# Patient Record
Sex: Female | Born: 1985 | Race: White | Hispanic: No | Marital: Married | Smoking: Never smoker
Health system: Southern US, Community
[De-identification: ages and names within clinical notes are randomized; demographics above are authoritative.]

## PROBLEM LIST (undated history)

## (undated) DIAGNOSIS — R87619 Unspecified abnormal cytological findings in specimens from cervix uteri: Secondary | ICD-10-CM

## (undated) DIAGNOSIS — I341 Nonrheumatic mitral (valve) prolapse: Secondary | ICD-10-CM

## (undated) DIAGNOSIS — R011 Cardiac murmur, unspecified: Secondary | ICD-10-CM

## (undated) DIAGNOSIS — K219 Gastro-esophageal reflux disease without esophagitis: Secondary | ICD-10-CM

## (undated) DIAGNOSIS — O1490 Unspecified pre-eclampsia, unspecified trimester: Secondary | ICD-10-CM

## (undated) DIAGNOSIS — G43909 Migraine, unspecified, not intractable, without status migrainosus: Secondary | ICD-10-CM

## (undated) DIAGNOSIS — F419 Anxiety disorder, unspecified: Secondary | ICD-10-CM

## (undated) DIAGNOSIS — K5090 Crohn's disease, unspecified, without complications: Secondary | ICD-10-CM

## (undated) HISTORY — DX: Crohn's disease, unspecified, without complications: K50.90

## (undated) HISTORY — PX: COLONOSCOPY: SHX174

## (undated) HISTORY — DX: Migraine, unspecified, not intractable, without status migrainosus: G43.909

## (undated) HISTORY — DX: Unspecified abnormal cytological findings in specimens from cervix uteri: R87.619

## (undated) HISTORY — PX: BREAST SURGERY: SHX581

## (undated) HISTORY — DX: Anxiety disorder, unspecified: F41.9

## (undated) HISTORY — PX: CHOLECYSTECTOMY: SHX55

## (undated) HISTORY — DX: Gastro-esophageal reflux disease without esophagitis: K21.9

## (undated) HISTORY — DX: Cardiac murmur, unspecified: R01.1

---
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
## 2005-09-30 ENCOUNTER — Ambulatory Visit: Payer: Self-pay | Admitting: Unknown Physician Specialty

## 2005-10-02 ENCOUNTER — Ambulatory Visit: Payer: Self-pay | Admitting: Gastroenterology

## 2005-10-03 ENCOUNTER — Ambulatory Visit: Payer: Self-pay | Admitting: Gastroenterology

## 2005-11-08 ENCOUNTER — Emergency Department: Payer: Self-pay | Admitting: Emergency Medicine

## 2005-11-11 ENCOUNTER — Inpatient Hospital Stay: Payer: Self-pay | Admitting: Gastroenterology

## 2008-08-20 ENCOUNTER — Emergency Department: Payer: Self-pay | Admitting: Emergency Medicine

## 2008-08-22 ENCOUNTER — Observation Stay: Payer: Self-pay | Admitting: Specialist

## 2010-10-15 ENCOUNTER — Ambulatory Visit: Payer: Self-pay | Admitting: Internal Medicine

## 2012-07-03 ENCOUNTER — Observation Stay: Payer: Self-pay | Admitting: Internal Medicine

## 2012-07-31 ENCOUNTER — Inpatient Hospital Stay: Payer: Self-pay

## 2012-07-31 LAB — CBC WITH DIFFERENTIAL/PLATELET
Basophil #: 0 10*3/uL (ref 0.0–0.1)
Basophil %: 0.1 %
Eosinophil #: 0 10*3/uL (ref 0.0–0.7)
Lymphocyte %: 13.2 %
MCH: 32.3 pg (ref 26.0–34.0)
MCHC: 35 g/dL (ref 32.0–36.0)
MCV: 92 fL (ref 80–100)
Monocyte %: 4.7 %
Neutrophil %: 81.7 %
Platelet: 237 10*3/uL (ref 150–440)

## 2012-11-09 LAB — URINALYSIS, COMPLETE
Bilirubin,UR: NEGATIVE
Glucose,UR: NEGATIVE mg/dL (ref 0–75)
Leukocyte Esterase: NEGATIVE
Ph: 5 (ref 4.5–8.0)
RBC,UR: 10 /HPF (ref 0–5)
Squamous Epithelial: 23
WBC UR: 2 /HPF (ref 0–5)

## 2012-11-09 LAB — COMPREHENSIVE METABOLIC PANEL
Anion Gap: 6 — ABNORMAL LOW (ref 7–16)
Bilirubin,Total: 0.6 mg/dL (ref 0.2–1.0)
Co2: 27 mmol/L (ref 21–32)
Creatinine: 1.04 mg/dL (ref 0.60–1.30)
EGFR (Non-African Amer.): >60
Glucose: 95 mg/dL (ref 65–99)
Osmolality: 275 (ref 275–301)
Potassium: 3.6 mmol/L (ref 3.5–5.1)
Sodium: 138 mmol/L (ref 136–145)
Total Protein: 7.7 g/dL (ref 6.4–8.2)

## 2012-11-09 LAB — CBC
HGB: 14.2 g/dL (ref 12.0–16.0)
MCH: 31.5 pg (ref 26.0–34.0)
MCHC: 34.4 g/dL (ref 32.0–36.0)
MCV: 92 fL (ref 80–100)
Platelet: 233 10*3/uL (ref 150–440)

## 2012-11-09 LAB — LIPASE, BLOOD: Lipase: 107 U/L (ref 73–393)

## 2012-11-10 ENCOUNTER — Observation Stay: Payer: Self-pay | Admitting: Internal Medicine

## 2012-11-10 LAB — CBC WITH DIFFERENTIAL/PLATELET
Basophil %: 0.2 %
HCT: 35.9 % (ref 35.0–47.0)
HGB: 12.5 g/dL (ref 12.0–16.0)
Lymphocyte %: 24.5 %
MCH: 31.8 pg (ref 26.0–34.0)
MCHC: 34.9 g/dL (ref 32.0–36.0)
MCV: 91 fL (ref 80–100)
Neutrophil #: 3.2 10*3/uL (ref 1.4–6.5)
Platelet: 203 10*3/uL (ref 150–440)
RBC: 3.95 10*6/uL (ref 3.80–5.20)
RDW: 13.4 % (ref 11.5–14.5)

## 2012-11-10 LAB — COMPREHENSIVE METABOLIC PANEL
Alkaline Phosphatase: 54 U/L (ref 50–136)
BUN: 9 mg/dL (ref 7–18)
Chloride: 106 mmol/L (ref 98–107)
EGFR (African American): >60
EGFR (Non-African Amer.): >60
Osmolality: 274 (ref 275–301)
Potassium: 3.4 mmol/L — ABNORMAL LOW (ref 3.5–5.1)
SGOT(AST): 24 U/L (ref 15–37)
SGPT (ALT): 50 U/L (ref 12–78)
Sodium: 138 mmol/L (ref 136–145)

## 2012-11-10 LAB — PREGNANCY, URINE: Pregnancy Test, Urine: NEGATIVE m[IU]/mL

## 2014-05-26 ENCOUNTER — Ambulatory Visit (INDEPENDENT_AMBULATORY_CARE_PROVIDER_SITE_OTHER): Payer: 59

## 2014-05-26 ENCOUNTER — Other Ambulatory Visit: Payer: Self-pay | Admitting: *Deleted

## 2014-05-26 ENCOUNTER — Ambulatory Visit (INDEPENDENT_AMBULATORY_CARE_PROVIDER_SITE_OTHER): Payer: 59 | Admitting: Podiatry

## 2014-05-26 ENCOUNTER — Encounter: Payer: Self-pay | Admitting: Podiatry

## 2014-05-26 VITALS — BP 113/58 | HR 88 | Resp 16 | Ht 68.0 in | Wt 230.0 lb

## 2014-05-26 DIAGNOSIS — M779 Enthesopathy, unspecified: Secondary | ICD-10-CM

## 2014-05-26 DIAGNOSIS — M8448XA Pathological fracture, other site, initial encounter for fracture: Secondary | ICD-10-CM

## 2014-05-26 NOTE — Progress Notes (Signed)
   Subjective:    Patient ID: Angela Braun, female    DOB: July 12, 1986, 28 y.o.   MRN: [id number]  HPI Comments: Last week at work i was walking and i am not sure if i turned it wrong or what it started hurting . 2,3,4 toes to the top of foot is hurting   Foot Injury       Review of Systems  All other systems reviewed and are negative.      Objective:   Physical Exam        Assessment & Plan:

## 2014-05-26 NOTE — Progress Notes (Signed)
Subjective:     Patient ID: Angela Braun, female   DOB: 26-Jun-1986, 28 y.o.   MRN: [id number]  Foot Injury    patient presents stating last Wednesday I turned my right foot and it started to become extremely sore. I feel like I'm walking on a broken bone and I also get numbness in my second third and fourth toes   Review of Systems  All other systems reviewed and are negative.      Objective:   Physical Exam  Nursing note and vitals reviewed. Constitutional: She is oriented to person, place, and time.  Cardiovascular: Intact distal pulses.   Musculoskeletal: Normal range of motion.  Neurological: She is oriented to person, place, and time.  Skin: Skin is warm.   neurovascular status intact with muscle strength adequate and range of motion of subtalar midtarsal joint within normal limits. Patient is noted to have significant forefoot edema right with digits that are well perfused and is noted to have exquisite discomfort around the second metatarsal shaft right extending into the metatarsophalangeal joint    Assessment:     Possible stress fracture of the right second metatarsal shaft with other inflammatory conditions that cannot be excluded    Plan:     H&P and x-ray reviewed. Due to the level of discomfort I did go ahead today and I applied an air fracture walker to reduce all stress on the foot and advised on ice and compression therapy. Reappoint in 3 weeks

## 2014-06-16 ENCOUNTER — Ambulatory Visit (INDEPENDENT_AMBULATORY_CARE_PROVIDER_SITE_OTHER): Payer: 59

## 2014-06-16 ENCOUNTER — Ambulatory Visit (INDEPENDENT_AMBULATORY_CARE_PROVIDER_SITE_OTHER): Payer: 59 | Admitting: Podiatry

## 2014-06-16 VITALS — BP 147/76 | HR 93 | Resp 16

## 2014-06-16 DIAGNOSIS — S92301D Fracture of unspecified metatarsal bone(s), right foot, subsequent encounter for fracture with routine healing: Secondary | ICD-10-CM

## 2014-06-16 NOTE — Progress Notes (Signed)
Subjective:     Patient ID: Angela Braun, female   DOB: Feb 03, 1986, 28 y.o.   MRN: [id number]  HPI patient states that it is still tender but improved from over where it was but still requires boot for a good period of time   Review of Systems     Objective:   Physical Exam Neurovascular status intact with discomfort mostly in the second metatarsal distal shaft when pressed    Assessment:     Probable stress fracture right second metatarsal that's healing slowly but improving    Plan:     X-ray reviewed and continue with immobilization with gradual increase in tennis shoe usage and should be out of the boot within the next 3-4 weeks. Continue ice therapy and reappoint if symptoms continue past another 4-6 weeks

## 2014-10-19 ENCOUNTER — Ambulatory Visit: Payer: Self-pay | Admitting: Internal Medicine

## 2014-12-29 NOTE — Consult Note (Signed)
PATIENT NAME:  Angela Braun, SAGEL MR#:  [id number] DATE OF BIRTH:  05-23-1986  DATE OF CONSULTATION:  11/10/2012  CONSULTING PHYSICIAN:  Cristal Deer A. Lundquist, MD  REASON FOR CONSULTATION: Right upper quadrant abdominal pain, nausea and vomiting.   HISTORY OF PRESENT ILLNESS: The patient is a pleasant 29 year old female with history of hypertension who presented to the ER a history of 3 days of right upper quadrant pain with nausea and vomiting. She says that this began acutely. She has no sick contacts. She also developed diarrhea, which has resolved. She had 1 temperature of 101.3, which has resolved with Aleve. Is feeling better all in all, still has some uncomfortableness in her right upper quadrant. Pain medicine has made it better. Has had a history of what she said is Crohn disease, which was initially diagnosed on colonoscopy approximately 2 years ago that she was treated for, and repeat colonoscopy did not show any illness so is no longer on medications for Crohn's. Also no night sweats, shortness of breath, cough, chest pain, current nausea, vomiting or recent diarrhea. No dysuria or hematuria. No headaches. No abnormal sensations in extremities.   PAST MEDICAL HISTORY: Hypertension, postpartum depression, questionable Crohn's.   PAST SURGICAL HISTORY: None.   ALLERGIES: PENICILLIN AND TAPE.   HOME MEDICATIONS: Prenatal vitamins, metoprolol and ibuprofen p.r.n.   SOCIAL HISTORY: Lives in [place] with husband. Denies smoking and street drugs. Social alcohol use.   FAMILY HISTORY: Has a father who was deceased with history of cancer, which she is uncertain regarding.   REVIEW OF SYSTEMS: Total 12-point review of systems was obtained. Pertinent positives and negatives as above.   PHYSICAL EXAMINATION:  VITAL SIGNS: Temperature 98.3, pulse 88, blood pressure 97/65, respirations 18, 95% on room air.  GENERAL: No acute distress. Alert and oriented x 3.  HEAD: Normocephalic,  atraumatic.  EYES: No scleral icterus. No conjunctivitis.  FACE: No obvious facial trauma. Normal external nose. Normal external ears.  NECK: Soft, supple. No obvious masses.  CHEST: Lungs clear to auscultation, moving air well.  HEART: Regular rate and rhythm. No murmurs, rubs or gallops.  ABDOMEN: Soft, minimally tender, nondistended.  EXTREMITIES: Moves all extremities well. Strength 5/5.  NEUROLOGIC: Cranial nerves II through XII grossly intact. Sensation intact all 4 extremities.   LABORATORY AND RADIOLOGIC DATA: Unremarkable white cell count today of 5.0 with 63.6% neutrophils. I have reviewed all pertinent positives and negatives. Does have 10+ red blood cells, but on a dirty urinalysis. Ultrasound shows normal gallbladder with dilated common bile duct to 6.9 mm. MRCP shows no obvious filling defects.   ASSESSMENT AND PLAN: The patient is a pleasant 29 year old female with 3 days of nausea, vomiting, diarrhea. This has improved. Her pain has improved. Has no white cell count, no tachycardia, relatively nontender exam and no gallstones on ultrasound. Also has no filling defects on MRCP and no LFT abnormalities. I would favor gastroenteritis; however, with history of treatment for Crohn's, will always consider that as this pain is somewhat similar. No need for cholecystectomy. If continues to have pain despite negative workup, will consider HIDA with ejection fraction but as this is the first time that she has had this pain, I am unwilling to take her gallbladder out for biliary dyskinesia until other possibilities have been ruled out.    ____________________________ Si Raider. Lundquist, MD cal:jm D: 11/10/2012 16:35:00 ET T: 11/10/2012 17:51:21 ET JOB#: [id number]  cc: Cristal Deer A. Lundquist, MD, <Dictator> Jarvis Newcomer MD ELECTRONICALLY SIGNED 11/13/2012 11:12

## 2014-12-29 NOTE — Discharge Summary (Signed)
PATIENT NAME:  Angela Braun, Angela Braun MR#:  [id number] DATE OF BIRTH:  [birth year]  DATE OF ADMISSION:  11/10/2012 DATE OF DISCHARGE:  11/11/2012   FINAL DIAGNOSES: Viral gastroenteritis, hypertension, depression, history of Crohn's disease.   HISTORY OF PRESENT ILLNESS: A 29 year old female with past medical history of hypertension and postpartum depression presenting to the ER with chief complaint of a 3-day history of right upper quadrant pain associated with nausea and vomiting. Eventually, she developed diarrhea the day before, which was completely resolved on the day of presentation to the Emergency Room. She developed a temperature of 101.3 degrees Fahrenheit, so she decided to present to the Emergency Room. No dizziness or loss of consciousness. No any sick contacts. A right upper quadrant ultrasound was done, which revealed no gallstone but dilated common bile duct, which was 6.9 mm.   HOSPITAL COURSE AND STAY: As mentioned above, a right upper quadrant sonogram was done, which showed dilated CBD but no gallstone, normal LFTs. So, MRCP was done, which was normal too. So, we attributed her complaints to possible gastroenteritis, and IV fluids and morphine continued.   OTHER MEDICAL ISSUES:  1. Hypertension, continued on metoprolol. 2. Postpartum depression, which was a stable issue.   3. History of Crohn's disease, which was stable, and she was advised to follow with her primary care physician for this.   LABORATORY RESULTS IN THE HOSPITAL: WBC on presentation 9100, creatinine was 1.04, sodium 138, lipase 107. Urinalysis grossly negative. MRCP showed normal exam, no evidence of biliary obstruction or mass.   CONSULTATIONS DURING THE HOSPITAL: Surgical consult with Dr. Juliann Pulse. He suggested really unlikely cholecystitis, and so he said no need for surgical intervention at this time.   CONDITION ON DISCHARGE: Stable.   CODE STATUS ON DISCHARGE: Full code.   MEDICATIONS ON DISCHARGE: Prenatal  multivitamin, oral tablet once daily, metoprolol 50 mg every 12 hours.   HOME OXYGEN: No.   DIET: Low sodium, regular consistency.   ACTIVITY: As tolerated.   TIMEFRAME TO FOLLOW-UP: Within 1 to 2 weeks with primary care physician.   TOTAL TIME SPENT: 45 minutes.    ____________________________ Hope Pigeon Elisabeth Pigeon, MD vgv:lo D: 11/15/2012 23:59:30 ET T: 11/16/2012 11:29:38 ET JOB#: [id number]  cc: Hope Pigeon. Elisabeth Pigeon, MD, <Dictator> Altamese Dilling MD ELECTRONICALLY SIGNED 12/06/2012 9:25

## 2014-12-29 NOTE — H&P (Signed)
PATIENT NAME:  Angela Braun, Angela Braun MR#:  [id number] DATE OF BIRTH:  [birth year]  DATE OF ADMISSION:  11/10/2012  PRIMARY CARE PHYSICIAN:  Nonlocal.   REFERRING PHYSICIAN:  Dr. Manson Passey.   CHIEF COMPLAINT:  Right upper quadrant abdominal pain associated with nausea, vomiting.   HISTORY OF PRESENT ILLNESS:  The patient is a 29 year old female with a past medical history of hypertension and postpartum depression is presenting to the ER with a chief complaint of a three day history of right upper quadrant abdominal pain associated with nausea and vomiting.  Eventually, she has developed diarrhea yesterday which was completely resolved today.  She developed temperature of 101.3 degrees Fahrenheit x 1 which was completely resolved with one dose of Aleve.  The patient denies any dizziness or loss of consciousness.  No sick contacts.  No similar complaints in the past.  Right upper quadrant ultrasound has revealed no gallstones, but dilated common bile duct which is 6.9 mm.  The patient was given some pain medicine and hospitalist team is called to admit the patient.  The ER physician has also called the on-call surgeon Dr. Anda Kraft who has recommended to get MRCP done in the a.m.  During my examination, the patient is reporting sharp right upper quadrant abdominal pain without any radiation associated with nausea and vomiting.  The pain is 7 out of 10.  No similar complaints in the past.  Denies any chest pain or shortness of breath.  Denies any blood in her vomit or stool.   PAST MEDICAL HISTORY:  Hypertension, postpartum depression.   PAST SURGICAL HISTORY:  None.   ALLERGIES:  To PENICILLIN AND TAPE.   HOME MEDICATIONS:  Prenatal multivitamins once a day, metoprolol tartrate 50 mg q. 12 hours, ibuprofen 600 mg q. 6 hours as needed.   PSYCHOSOCIAL HISTORY:  Lives at home with husband.  Denies smoking.  Occasional intake of alcohol.  Denies any street drugs.   FAMILY HISTORY:  Her father deceased with some  cancer.   REVIEW OF SYSTEMS:   CONSTITUTIONAL:  One episode of fever which is resolved with Aleve.  Denies any weakness or fatigue.  Complaining of right upper quadrant abdominal pain.  EYES:  No blurry vision or glaucoma.  EARS, NOSE, THROAT:  Denies any epistaxis, discharge, postnasal drip.  RESPIRATORY:  Denies any COPD, cough, wheezing.  CARDIOVASCULAR:  No chest pain or palpitations.  GASTROINTESTINAL:  Complaining of nausea, vomiting, diarrhea and right upper quadrant abdominal pain.  No hematemesis or melena.  GENITOURINARY:  No dysuria, hematuria.  GYNECOLOGIC AND BREASTS:  No breast mass or vaginal discharge.  ENDOCRINE:  Denies any polyuria, nocturia or thyroid problems.  HEMATOLOGIC AND LYMPHATIC:  No anemia, easy bruising or bleeding.  INTEGUMENTARY:  No acne, rash, lesions.  MUSCULOSKELETAL:  No joint pain.  Denies any arthritis or gout.  NEUROLOGIC:  Denies any vertigo, ataxia, dementia, headache.  PSYCHIATRIC:  Has postpartum depression.  Denies any ADD, OCD.   PHYSICAL EXAMINATION: VITAL SIGNS:  Temperature 98 degrees Fahrenheit, pulse 90, respirations 20, blood pressure is 93/59, pulse of 98%.  GENERAL APPEARANCE:  Not under acute distress, moderately built and moderately nourished, obese.  HEENT:  Normocephalic, atraumatic.  Pupils are equally reacting to light and accommodation.  No scleral icterus.  No conjunctival injection.  Extraocular movements are intact.  No postnasal drip.  No sinus tenderness.  Moist mucous membranes.  Tympanic membranes are intact.  NECK:  Supple.  No JVD.  No thyromegaly.  Range of motion  is intact.  LUNGS:  Clear to auscultation bilaterally.  No accessory muscle usage.  No anterior chest wall tenderness on palpation.  CARDIAC:  S1, S2 normal.  Regular rate and rhythm.  No murmurs.  No peripheral edema.  GASTROINTESTINAL:  Soft.  Bowel sounds are positive in all four quadrants.  Positive right upper quadrant tenderness.  No rebound tenderness.   Positive Murphy's sign.  No CVA tenderness.  No masses felt.  No hepatosplenomegaly.  NEUROLOGIC:  Awake, alert, oriented x 3.  Cranial nerves II through XII are grossly intact.  Motor and sensory are grossly intact.  Reflexes are 2+.  EXTREMITIES:  No edema.  No cyanosis.  No clubbing, 2+ peripheral pulses.  SKIN:  No rashes, lesions, acne.  Normal turgor.  Warm to touch.  PSYCHIATRIC:  Normal mood and affect.   LABORATORY AND IMAGING STUDIES:  Ultrasound of the abdomen has revealed abnormal dilatation of the common bile duct without definite intrahepatic biliary ductal dilatation.  The common bile duct is abnormally distended up to 6.9 mm.  Distal obstructing process is a consideration.  No definite cholelithiasis evident.  No definite findings of acute cholecystitis.  Her glucose 95, BUN 11, creatinine 1.04, sodium 138, potassium 3.6, chloride 105, CO2 27, GFR greater than 60, osmolality 275, calcium 8.6, lipase 107.  LFTs are within normal range.  WBC 9.1, hemoglobin 14.2, hematocrit 41.2, platelet count is 233,000, MCV 92.  Urinalysis, stat pregnancy test is ordered which is pending.  Nitrites are negative, leukocyte esterase negative, glucose negative, bili negative, ketones negative.  Mucous is present.   ASSESSMENT AND PLAN:  The patient is a 29 year old moderately obese female presenting with right upper quadrant abdominal pain associated with one episode of fever and vomiting for three days, will be admitted with the following assessment and plan.  1.  Acute right upper quadrant abdominal pain with dilated common bile duct, but no gallstones and normal LFTs.  We will keep her nothing by mouth.  We will provide her IV fluids and proton pump inhibitor.  MRCP is ordered.  It will be done if pregnancy test is negative as recommended by surgery.  Surgical consult is placed to Dr. Anda Kraft and it was notified Dr. Anda Kraft by Dr. Manson Passey, the ER physician.  2.  Nausea, vomiting and diarrhea, which can be  from acute gastroenteritis, probably viral.  We will provide her IV fluids and antinausea medication.  Right now diarrhea is resolved and she is reporting no bowel movement since this afternoon. 3.  Postpartum depression.  Stable.  4.  Hypertension.  We will resume her home medication metoprolol.   5.  We will provide her gastrointestinal prophylaxis with Protonix IV.  6.  Deep vein thrombosis prophylaxis is not needed as the patient is ambulatory.   The diagnosis and plan of care was discussed in detail with the patient.  She is aware of the plan.   TOTAL TIME SPENT ON ADMISSION:  Is 50 minutes.    ____________________________ Ramonita Lab, MD ag:ea D: 11/10/2012 01:14:06 ET T: 11/10/2012 03:30:42 ET JOB#: [id number]  cc: Ramonita Lab, MD, <Dictator> Ramonita Lab MD ELECTRONICALLY SIGNED 11/12/2012 2:30

## 2015-01-16 NOTE — H&P (Signed)
L&D Evaluation:  History:   HPI 29 year old G1P0 presents to L&D at 13 3/7 weeks with SROM since 0530 this AM. Irreg ctx's noted upon arrival. EDD 08/11/12, [facility] at [facility] notable for early entry to care. No significant events during pregnancy, Pt does have hx of MVP which she takes Metoprolol for daily. Labs: O Positive, RI, VI, GBS Negative    Presents with leaking fluid    Patient's Medical History MVP, crohn's disease    Patient's Surgical History none  colonoscopy    Medications Pre Natal Vitamins  metoprolol    Allergies PCN    Social History none    Family History Non-Contributory   ROS:   ROS All systems were reviewed.  HEENT, CNS, GI, GU, Respiratory, CV, Renal and Musculoskeletal systems were found to be normal.   Exam:   Vital Signs stable    Urine Protein not completed    General no apparent distress    Mental Status clear    Abdomen gravid, non-tender    Estimated Fetal Weight Average for gestational age    Back no CVAT    Edema no edema    Pelvic no external lesions, 1-2 per RN exam    Mebranes Ruptured    Description clear    FHT normal rate with no decels    Ucx irregular   Impression:   Impression early labor, SROM   Plan:   Plan EFM/NST, monitor contractions and for cervical change, pitocin for augmentation   Electronic Signatures: Shella Maxim (CNM)  (Signed 23-Nov-13 11:44)  Authored: L&D Evaluation   Last Updated: 23-Nov-13 11:44 by Shella Maxim (CNM)

## 2015-01-16 NOTE — H&P (Signed)
L&D Evaluation:  History:   HPI 29 year old G1 P0 with EDC=08/11/2012 by LMP=11/05/2011 presents at 36 3/7 weeks with c/o leaking fluid x 2 days. Was unsure if she was leaking urine. Did have some bleeding yesterday after IC. Works long hours at work and has noticed contractions at the end of the day. Contractions yesterday resolved with rest and fluids. Baby active. Prenatal care at [facility] remarkable for early care with a 7w ultrasound confirming dates, MVP (takes metoprolol 50 mgm BID), and obesity.    Presents with leaking fluid    Patient's Medical History MVP    Patient's Surgical History colooscopy    Medications Pre Natal Vitamins  Metoprolol 50 mgm BID    Allergies PCN    Social History none    Family History Non-Contributory   ROS:   ROS see HPI   Exam:   Vital Signs stable  126/81, 123/77, 120/79. afebrile    Urine Protein not completed    General no apparent distress    Mental Status clear    Abdomen gravid, non-tender    Estimated Fetal Weight Average for gestational age    Fetal Position cephalic    Edema 1+    Pelvic no external lesions, SSE: no pooloing and Nitrazine negative. wet prep negative for hyphae, clue cells, or Trich. Cervix soft, closed internal os/50%/-2. discharge clear to white mucoepithelial    Mebranes Intact    FHT normal rate with no decels, reactive NST with baseline 125 and accels to 170    FHT Description mod variability    Fetal Heart Rate 125    Ucx irregular, mild    Skin dry   Impression:   Impression IUP at 34 3/7 weeks with no evidence of SROM or labor   Plan:   Plan DC home with labor precautions and work note to decrease hours to max of 8hrs/day and 40 hrs/week. 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 FU as scheduled at [facility] and prn.   Electronic Signatures: Karene Fry (CNM)  (Signed 26-Oct-13 14:44)  Authored: L&D Evaluation   Last Updated: 26-Oct-13 14:44 by Karene Fry  (CNM)

## 2015-10-24 LAB — HM PAP SMEAR: HM PAP: NORMAL

## 2015-11-14 ENCOUNTER — Other Ambulatory Visit: Payer: Self-pay | Admitting: Urology

## 2015-11-14 DIAGNOSIS — R3129 Other microscopic hematuria: Secondary | ICD-10-CM

## 2015-11-20 ENCOUNTER — Ambulatory Visit
Admission: RE | Admit: 2015-11-20 | Discharge: 2015-11-20 | Disposition: A | Discharge status: Home / Self Care | Payer: 59 | Source: Ambulatory Visit | Attending: Urology | Admitting: Urology

## 2015-11-20 DIAGNOSIS — R3129 Other microscopic hematuria: Secondary | ICD-10-CM

## 2015-11-20 HISTORY — DX: Nonrheumatic mitral (valve) prolapse: I34.1

## 2015-11-20 MED ORDER — IOHEXOL 300 MG/ML  SOLN
150.0000 mL | Freq: Once | INTRAMUSCULAR | Status: AC | PRN
  Administered 2015-11-20: 150 mL via INTRAVENOUS

## 2016-08-07 IMAGING — CT CT ABD-PEL WO/W CM
1 of 2 series · 13 of 32 positions shown, 19 images · IV contrast (APPLIED)
Comparison: Abdominal ultrasound of 11/09/2012.  CT 11/14/2005.

CLINICAL DATA: Microscopic hematuria. Pelvic discomfort after
urinating for 2 weeks.

EXAM:
CT ABDOMEN AND PELVIS WITHOUT AND WITH CONTRAST
TECHNIQUE: Multidetector CT imaging of the abdomen and pelvis was performed
following the standard protocol before and following the bolus
administration of intravenous contrast.
CONTRAST:  150mL OMNIPAQUE IOHEXOL 300 MG/ML  SOLN

[Series 2: axial pre · axial · non-contrast · 0.78mm/px · z∈[-991,-546]mm · 13 of 103 slices shown, 19 images]
[im 7/103  soft-tissue]
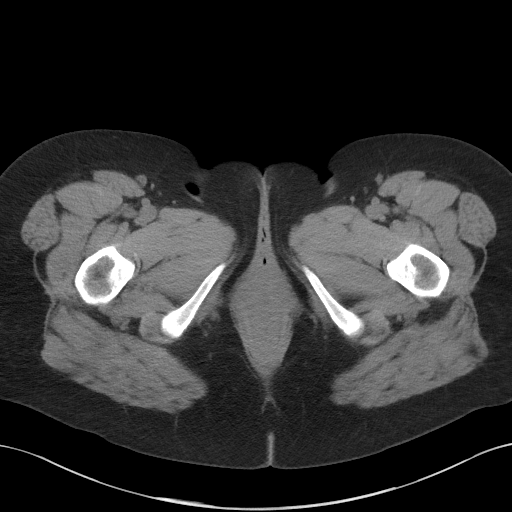
[im 7/103  bone]
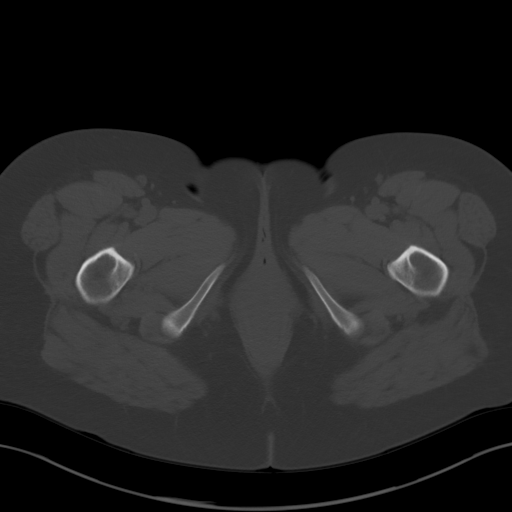
[im 14/103  soft-tissue]
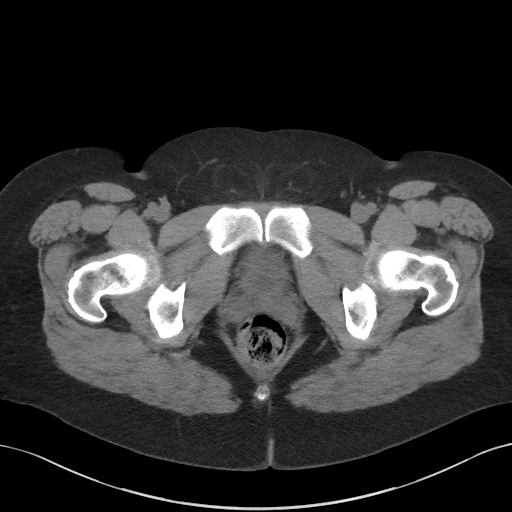
[im 21/103  soft-tissue]
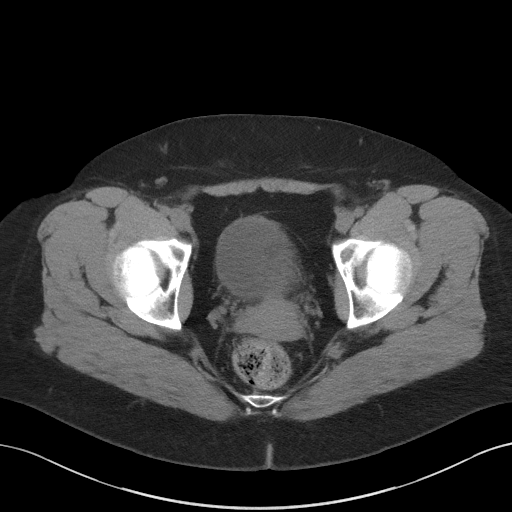
[im 28/103  soft-tissue]
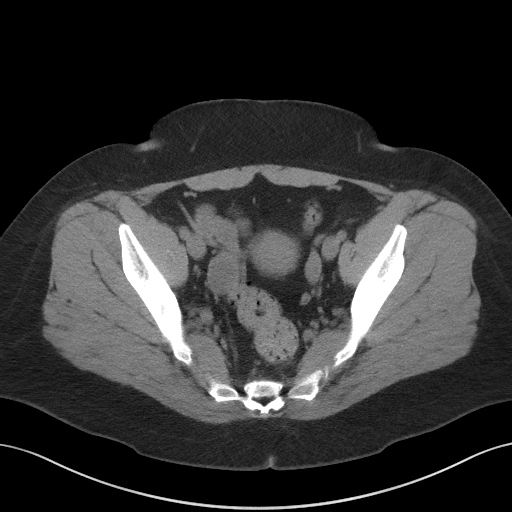
[im 35/103  soft-tissue]
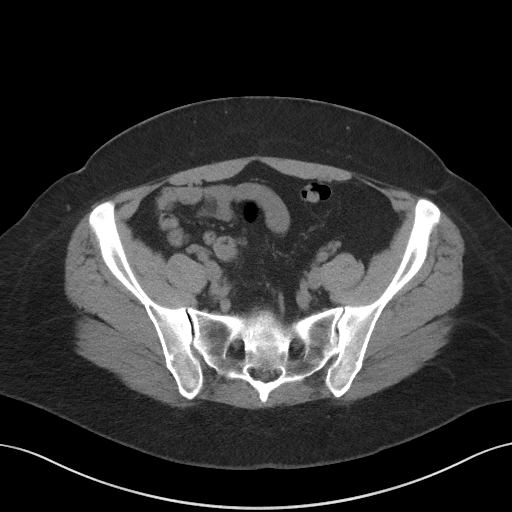
[im 41/103  soft-tissue]
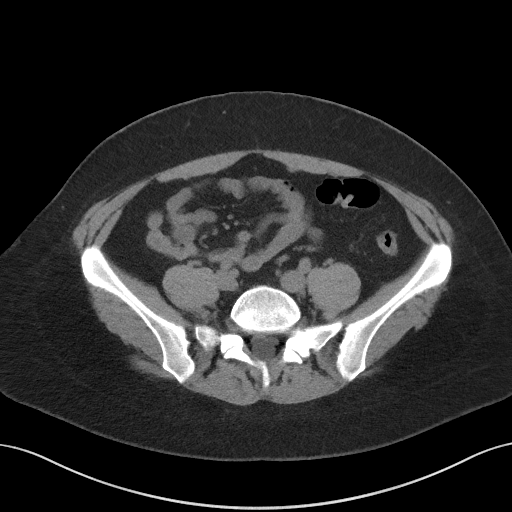
[im 55/103  soft-tissue]
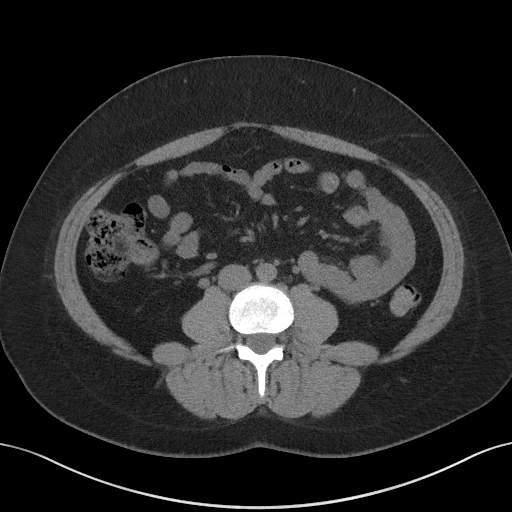
[im 62/103  soft-tissue]
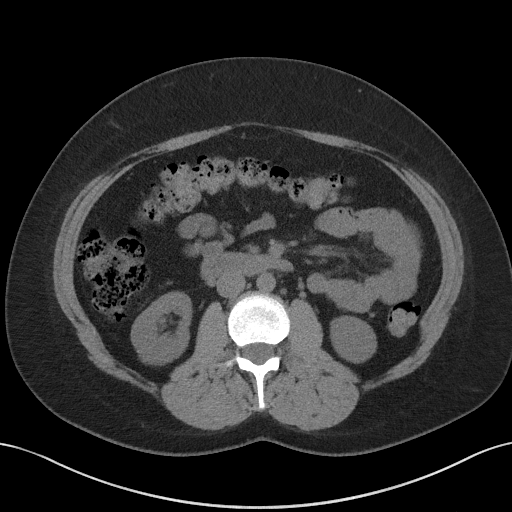
[im 69/103  soft-tissue]
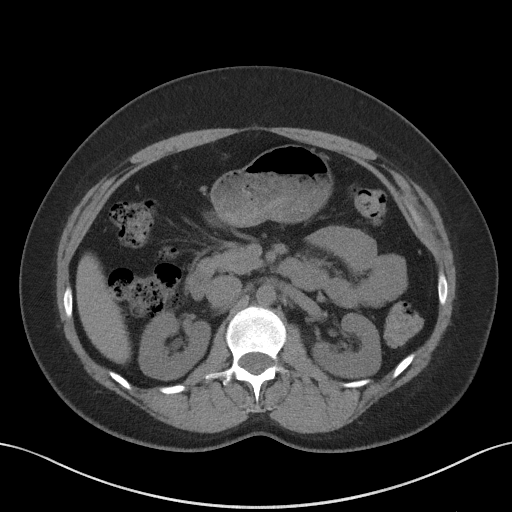
[im 69/103  bone]
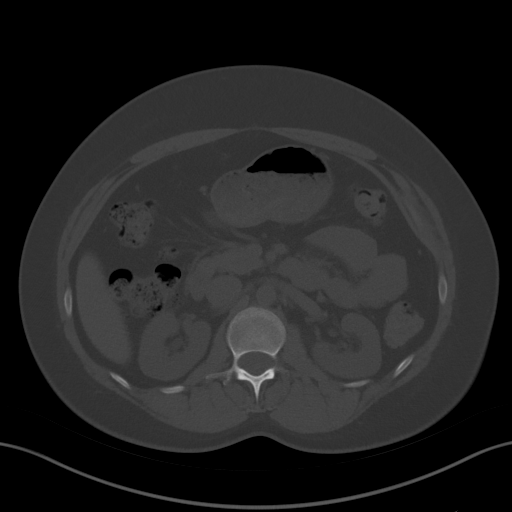
[im 75/103  soft-tissue]
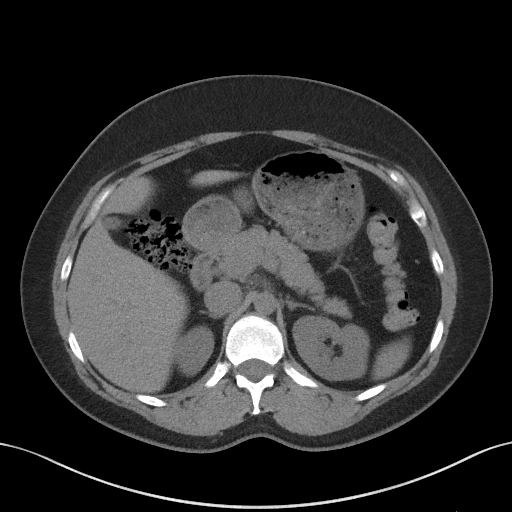
[im 75/103  lung]
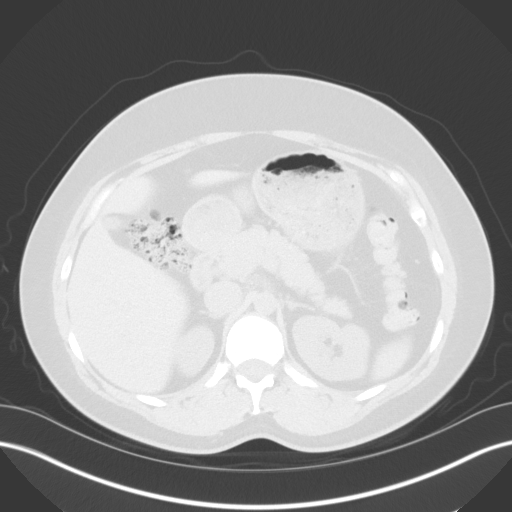
[im 82/103  soft-tissue]
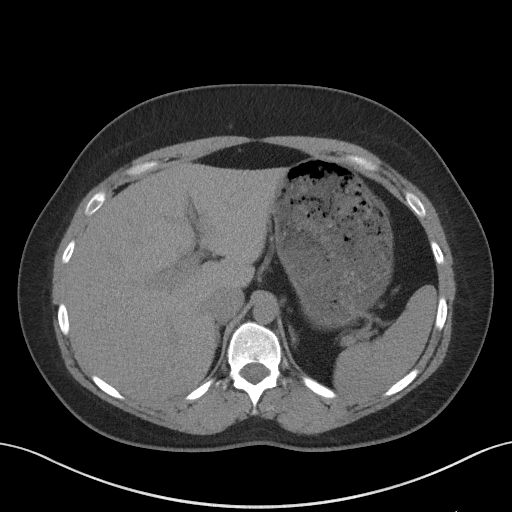
[im 82/103  lung]
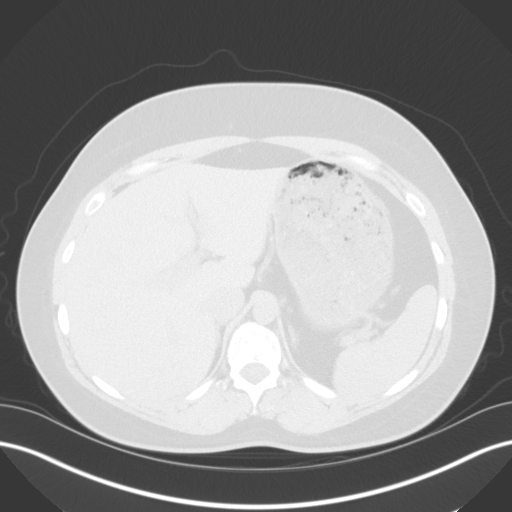
[im 89/103  soft-tissue]
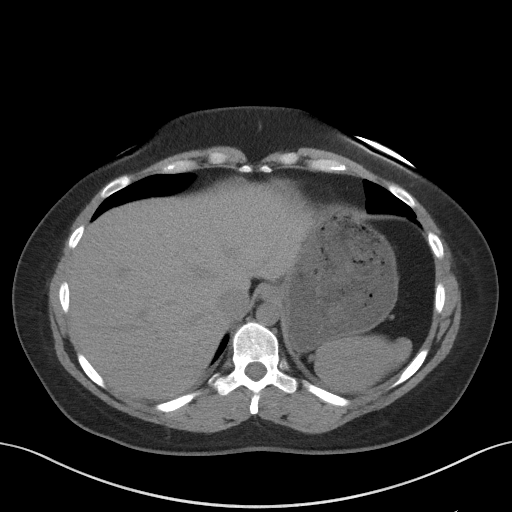
[im 89/103  lung]
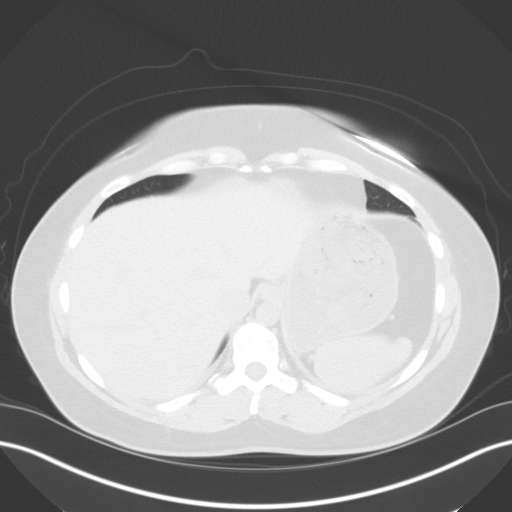
[im 96/103  soft-tissue]
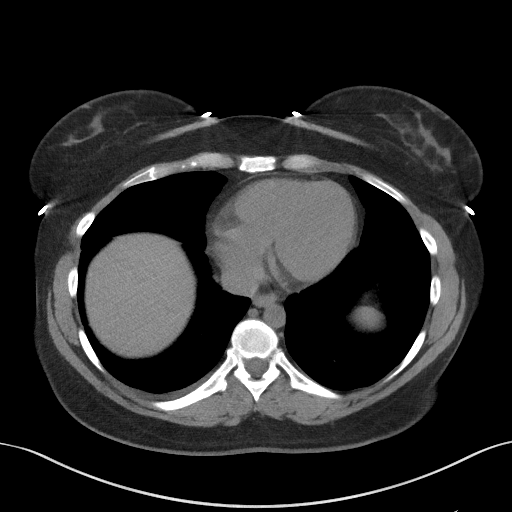
[im 96/103  lung]
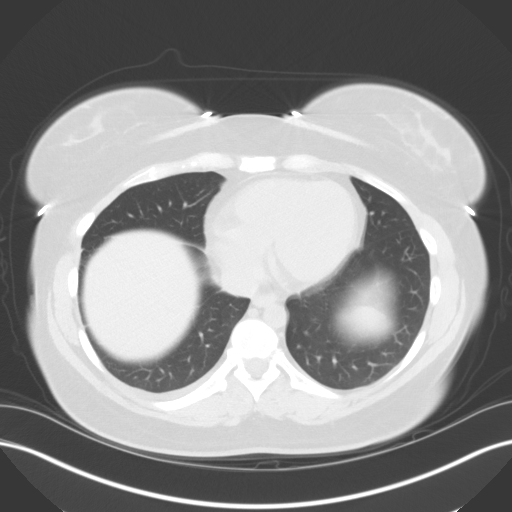

[13 of 32 positions shown; findings below may reference images not displayed]

FINDINGS: Lower chest: Clear lung bases. Normal heart size without pericardial
or pleural effusion.

Hepatobiliary: Normal liver. Normal gallbladder, without biliary
ductal dilatation.

Pancreas: Normal, without mass or ductal dilatation.

Spleen: Normal in size, without focal abnormality.

Adrenals/Urinary Tract: Normal adrenal glands. No renal calculi or
hydronephrosis. No hydroureter or ureteric calculi. No bladder
calculi.

No renal mass on post-contrast images. Moderate renal collecting
system opacification on delayed images. Moderate to good ureteric
opacification. No bladder filling defect.

Stomach/Bowel: Normal stomach, without wall thickening. Normal
colon, appendix, and terminal ileum. Normal small bowel.

Vascular/Lymphatic: Normal caliber of the aorta and branch vessels.
No abdominopelvic adenopathy.

Reproductive: Normal uterus and adnexa.

Other: No significant free fluid.

Musculoskeletal: No acute osseous abnormality.
IMPRESSION: No acute process or explanation for hematuria.

## 2017-11-11 ENCOUNTER — Ambulatory Visit (INDEPENDENT_AMBULATORY_CARE_PROVIDER_SITE_OTHER): Payer: Managed Care, Other (non HMO) | Admitting: Advanced Practice Midwife

## 2017-11-11 ENCOUNTER — Encounter: Payer: Self-pay | Admitting: Advanced Practice Midwife

## 2017-11-11 VITALS — BP 122/70 | Wt 204.0 lb

## 2017-11-11 DIAGNOSIS — Z113 Encounter for screening for infections with a predominantly sexual mode of transmission: Secondary | ICD-10-CM

## 2017-11-11 DIAGNOSIS — O0990 Supervision of high risk pregnancy, unspecified, unspecified trimester: Secondary | ICD-10-CM | POA: Insufficient documentation

## 2017-11-11 DIAGNOSIS — Z3480 Encounter for supervision of other normal pregnancy, unspecified trimester: Secondary | ICD-10-CM

## 2017-11-11 LAB — OB RESULTS CONSOLE GC/CHLAMYDIA
Chlamydia: NEGATIVE
GC PROBE AMP, GENITAL: NEGATIVE

## 2017-11-11 LAB — OB RESULTS CONSOLE VARICELLA ZOSTER ANTIBODY, IGG: Varicella: IMMUNE

## 2017-11-11 LAB — OB RESULTS CONSOLE RUBELLA ANTIBODY, IGM: RUBELLA: NON-IMMUNE/NOT IMMUNE

## 2017-11-11 NOTE — Progress Notes (Signed)
New Obstetric Patient H&P    Chief Complaint: "Desires prenatal care"   History of Present Illness: Patient is a 32 y.o. G2P1001 Not Hispanic or Latino female, presents with amenorrhea and positive home pregnancy test. Patient's last menstrual period was 10/10/2017. and based on her  LMP, her EDD is Estimated Date of Delivery: 07/17/2018. and her EGA is [redacted]w[redacted]d Cycles are 6. days, regular, and occur approximately every : 25 days. Her last pap smear was 2 years ago and was no abnormalities.    She had a urine pregnancy test which was positive 1 week(s)  ago. Her last menstrual period was normal and lasted for  4 day(s). Since her LMP she claims she has experienced breast tenderness, uncomfortable urination, itching on her legs. She denies vaginal bleeding. Her past medical history is contributory for gastrointestinal disease. Her prior pregnancies are notable for none  Since her LMP, she admits to the use of tobacco products  no She claims she has gained   8 pounds since the start of her pregnancy.  There are cats in the home in the home  no  She admits close contact with children on a regular basis  yes  She has had chicken pox in the past yes She has had Tuberculosis exposures, symptoms, or previously tested positive for TB   no Current or past history of domestic violence. no  Genetic Screening/Teratology Counseling: (Includes patient, baby's father, or anyone in either family with:)   [facility] 2. Thalassemia ([place] or [place] background): MCV<80  no 3. Neural tube defect (meningomyelocele, spina bifida, anencephaly)  no 4. Congenital heart defect  no  5. Down syndrome  no 6. Tay-Sachs ([place], [place]  no 7. Canavan's Disease  no 8. Sickle cell disease or trait (African)  no  9. Hemophilia or other blood disorders  no  10. Muscular dystrophy  no  11. Cystic fibrosis  no  12. Huntington's Chorea  no  13. Mental  retardation/autism  no 14. Other inherited genetic or chromosomal disorder  no 15. Maternal metabolic disorder (DM, PKU, etc)  no 16. Patient or FOB with a child with a birth defect not listed above no  16a. Patient or FOB with a birth defect themselves no 17. Recurrent pregnancy loss, or stillbirth  no  18. Any medications since LMP other than prenatal vitamins (include vitamins, supplements, OTC meds, drugs, alcohol)  no 19. Any other genetic/environmental exposure to discuss  no  Infection History:   1. Lives with someone with TB or TB exposed  no  2. Patient or partner has history of genital herpes  no 3. Rash or viral illness since LMP  no 4. History of STI (GC, CT, HPV, syphilis, HIV)  no 5. History of recent travel :  no  Other pertinent information:  no     Review of Systems:10 point review of systems negative unless otherwise noted in HPI  Past Medical History:  Past Medical History:  Diagnosis Date  . Abnormal Pap smear of cervix   . Crohn disease ([place]   . Migraine   . Mitral valve prolapse     Past Surgical History:  Past Surgical History:  Procedure Laterality Date  . COLONOSCOPY      Gynecologic History: Patient's last menstrual period was 10/10/2017.  Obstetric History: G2P1001  Family History:  Family History  Problem Relation Age of Onset  . Ovarian cancer Maternal Aunt  Social History:  Social History   Socioeconomic History  . Marital status: Married    Spouse name: Not on file  . Number of children: Not on file  . Years of education: Not on file  . Highest education level: Not on file  Social Needs  . Financial resource strain: Not on file  . Food insecurity - worry: Not on file  . Food insecurity - inability: Not on file  . Transportation needs - medical: Not on file  . Transportation needs - non-medical: Not on file  Occupational History  . Not on file  Tobacco Use  . Smoking status: Never Smoker  . Smokeless tobacco: Never  Used  Substance and Sexual Activity  . Alcohol use: No    Frequency: Never  . Drug use: No  . Sexual activity: Yes    Birth control/protection: None  Other Topics Concern  . Not on file  Social History Narrative  . Not on file    Allergies:  Allergies  Allergen Reactions  . Penicillins Hives    Medications: Prior to Admission medications   Not on File    Physical Exam Vitals: Blood pressure 122/70, weight 204 lb (92.5 kg), last menstrual period 10/10/2017.  General: NAD HEENT: normocephalic, anicteric Thyroid: no enlargement, no palpable nodules Pulmonary: No increased work of breathing, CTAB Cardiovascular: RRR, distal pulses 2+ Abdomen: NABS, soft, non-tender, non-distended.  Umbilicus without lesions.  No hepatomegaly, splenomegaly or masses palpable. No evidence of hernia  Genitourinary: deferred for early stage of pregnancy, no concerns, PAP screening interval/shared decision making   Extremities: no edema, erythema, or tenderness Neurologic: Grossly intact Psychiatric: mood appropriate, affect full   Assessment: 32 y.o. G2P1001 at 22w4dby LMP presenting to initiate prenatal care  Plan: 1) Avoid alcoholic beverages. 2) Patient encouraged not to smoke.  3) Discontinue the use of all non-medicinal drugs and chemicals.  4) Take prenatal vitamins daily.  5) Nutrition, food safety (fish, cheese advisories, and high nitrite foods) and exercise discussed. 6) Hospital and practice style discussed with cross coverage system.  7) Genetic Screening, such as with 1st Trimester Screening, cell free fetal DNA, AFP testing, and Ultrasound, as well as with amniocentesis and CVS as appropriate, is discussed with patient. At the conclusion of today's visit patient requested genetic testing 8) Patient is asked about travel to areas at risk for the Zika virus, and counseled to avoid travel and exposure to mosquitoes or sexual partners who may have themselves been exposed to the  virus. Testing is discussed, and will be ordered as appropriate.  9) Dating scan in 3 weeks   JRod Can CS[facility], CEdgertonGroup 11/11/2017, 9:51 AM

## 2017-11-11 NOTE — Progress Notes (Signed)
NOB today.  

## 2017-11-11 NOTE — Patient Instructions (Signed)
Prenatal Care WHAT IS PRENATAL CARE? Prenatal care is the process of caring for a pregnant woman before she gives birth. Prenatal care makes sure that she and her baby remain as healthy as possible throughout pregnancy. Prenatal care may be provided by a midwife, family practice health care provider, or a childbirth and pregnancy specialist (obstetrician). Prenatal care may include physical examinations, testing, treatments, and education on nutrition, lifestyle, and social support services. WHY IS PRENATAL CARE SO IMPORTANT? Early and consistent prenatal care increases the chance that you and your baby will remain healthy throughout your pregnancy. This type of care also decreases a baby's risk of being born too early (prematurely), or being born smaller than expected (small for gestational age). Any underlying medical conditions you may have that could pose a risk during your pregnancy are discussed during prenatal care visits. You will also be monitored regularly for any new conditions that may arise during your pregnancy so they can be treated quickly and effectively. WHAT HAPPENS DURING PRENATAL CARE VISITS? Prenatal care visits may include the following: Discussion Tell your health care provider about any new signs or symptoms you have experienced since your last visit. These might include:  Nausea or vomiting.  Increased or decreased level of energy.  Difficulty sleeping.  Back or leg pain.  Weight changes.  Frequent urination.  Shortness of breath with physical activity.  Changes in your skin, such as the development of a rash or itchiness.  Vaginal discharge or bleeding.  Feelings of excitement or nervousness.  Changes in your baby's movements.  You may want to write down any questions or topics you want to discuss with your health care provider and bring them with you to your appointment. Examination During your first prenatal care visit, you will likely have a complete  physical exam. Your health care provider will often examine your vagina, cervix, and the position of your uterus, as well as check your heart, lungs, and other body systems. As your pregnancy progresses, your health care provider will measure the size of your uterus and your baby's position inside your uterus. He or she may also examine you for early signs of labor. Your prenatal visits may also include checking your blood pressure and, after about 10-12 weeks of pregnancy, listening to your baby's heartbeat. Testing Regular testing often includes:  Urinalysis. This checks your urine for glucose, protein, or signs of infection.  Blood count. This checks the levels of white and red blood cells in your body.  Tests for sexually transmitted infections (STIs). Testing for STIs at the beginning of pregnancy is routinely done and is required in many states.  Antibody testing. You will be checked to see if you are immune to certain illnesses, such as rubella, that can affect a developing fetus.  Glucose screen. Around 24-28 weeks of pregnancy, your blood glucose level will be checked for signs of gestational diabetes. Follow-up tests may be recommended.  Group B strep. This is a bacteria that is commonly found inside a woman's vagina. This test will inform your health care provider if you need an antibiotic to reduce the amount of this bacteria in your body prior to labor and childbirth.  Ultrasound. Many pregnant women undergo an ultrasound screening around 18-20 weeks of pregnancy to evaluate the health of the fetus and check for any developmental abnormalities.  HIV (human immunodeficiency virus) testing. Early in your pregnancy, you will be screened for HIV. If you are at high risk for HIV, this test may  be repeated during your third trimester of pregnancy.  You may be offered other testing based on your age, personal or family medical history, or other factors. HOW OFTEN SHOULD I PLAN TO SEE MY  HEALTH CARE PROVIDER FOR PRENATAL CARE? Your prenatal care check-up schedule depends on any medical conditions you have before, or develop during, your pregnancy. If you do not have any underlying medical conditions, you will likely be seen for checkups:  Monthly, during the first 6 months of pregnancy.  Twice a month during months 7 and 8 of pregnancy.  Weekly starting in the 9th month of pregnancy and until delivery.  If you develop signs of early labor or other concerning signs or symptoms, you may need to see your health care provider more often. Ask your health care provider what prenatal care schedule is best for you. WHAT CAN I DO TO KEEP MYSELF AND MY BABY AS HEALTHY AS POSSIBLE DURING MY PREGNANCY?  Take a prenatal vitamin containing 400 micrograms (0.4 mg) of folic acid every day. Your health care provider may also ask you to take additional vitamins such as iodine, vitamin D, iron, copper, and zinc.  Take 1500-2000 mg of calcium daily starting at your 20th week of pregnancy until you deliver your baby.  Make sure you are up to date on your vaccinations. Unless directed otherwise by your health care provider: ? You should receive a tetanus, diphtheria, and pertussis (Tdap) vaccination between the 27th and 36th week of your pregnancy, regardless of when your last Tdap immunization occurred. This helps protect your baby from whooping cough (pertussis) after he or she is born. ? You should receive an annual inactivated influenza vaccine (IIV) to help protect you and your baby from influenza. This can be done at any point during your pregnancy.  Eat a well-rounded diet that includes: ? Fresh fruits and vegetables. ? Lean proteins. ? Calcium-rich foods such as milk, yogurt, hard cheeses, and dark, leafy greens. ? Whole grain breads.  Do noteat seafood high in mercury, including: ? Swordfish. ? Tilefish. ? Shark. ? King mackerel. ? More than 6 oz tuna per week.  Do not  eat: ? Raw or undercooked meats or eggs. ? Unpasteurized foods, such as soft cheeses (brie, blue, or feta), juices, and milks. ? Lunch meats. ? Hot dogs that have not been heated until they are steaming.  Drink enough water to keep your urine clear or pale yellow. For many women, this may be 10 or more 8 oz glasses of water each day. Keeping yourself hydrated helps deliver nutrients to your baby and may prevent the start of pre-term uterine contractions.  Do not use any tobacco products including cigarettes, chewing tobacco, or electronic cigarettes. If you need help quitting, ask your health care provider.  Do not drink beverages containing alcohol. No safe level of alcohol consumption during pregnancy has been determined.  Do not use any illegal drugs. These can harm your developing baby or cause a miscarriage.  Ask your health care provider or pharmacist before taking any prescription or over-the-counter medicines, herbs, or supplements.  Limit your caffeine intake to no more than 200 mg per day.  Exercise. Unless told otherwise by your health care provider, try to get 30 minutes of moderate exercise most days of the week. Do not  do high-impact activities, contact sports, or activities with a high risk of falling, such as horseback riding or downhill skiing.  Get plenty of rest.  Avoid anything that raises your  body temperature, such as hot tubs and saunas.  If you own a cat, do not empty its litter box. Bacteria contained in cat feces can cause an infection called toxoplasmosis. This can result in serious harm to the fetus.  Stay away from chemicals such as insecticides, lead, mercury, and cleaning or paint products that contain solvents.  Do not have any X-rays taken unless medically necessary.  Take a childbirth and breastfeeding preparation class. Ask your health care provider if you need a referral or recommendation.  This information is not intended to replace advice given  to you by your health care provider. Make sure you discuss any questions you have with your health care provider. Document Released: 08/28/2003 Document Revised: 01/28/2016 Document Reviewed: 11/09/2013 Elsevier Interactive Patient Education  2017 [employer]. Exercise During Pregnancy For people of all ages, exercise is an important part of being healthy. Exercise improves heart and lung function and helps to maintain strength, flexibility, and a healthy body weight. Exercise also boosts energy levels and elevates mood. For most women, maintaining an exercise routine throughout pregnancy is recommended. It is only on rare occasions and with certain medical conditions or pregnancy complications that women may be asked to limit or avoid exercise during pregnancy. What are some other benefits to exercising during pregnancy? Along with maintaining strength and flexibility, exercising throughout pregnancy can help to:  Keep strength in muscles that are very important during labor and childbirth.  Decrease low back pain during pregnancy.  Decrease the risk of developing gestational diabetes mellitus (GDM).  Improve blood sugar (glucose) control for women who have GDM.  Decrease the risk of developing preeclampsia. This is a serious condition that causes high blood pressure along with other symptoms, such as swelling and headaches.  Decrease the risk of cesarean delivery.  Speed up the recovery after giving birth.  How often should I exercise? Unless your health care provider gives you different instructions, you should try to exercise on most days or all days of the week. In general, try to exercise with moderate intensity for about 150 minutes per week. This can be spread out across several days, such as exercising for 30 minutes per day on 5 days of each week. You can tell that you are exercising at a moderate intensity if you have a higher heart rate and faster breathing, but you are still  able to hold a conversation. What types of moderate-intensity exercise are recommended during pregnancy? There are many types of exercise that are safe for you to do during pregnancy. Unless your health care provider gives you different instructions, do a variety of exercises that safely increase your heart and breathing (cardiopulmonary) rates and help you to build and maintain muscle strength (strength training). You should always be able to talk in full sentences while exercising during pregnancy. Some examples of exercising that is safe to do during pregnancy include:  Brisk walking or hiking.  Swimming.  Water aerobics.  Riding a stationary bike.  Strength training.  Modified yoga or Pilates. Tell your instructor that you are pregnant. Avoid overstretching and avoid lying on your back for long periods of time.  Running or jogging. Only choose this type of exercise if: ? You ran or jogged regularly before your pregnancy. ? You can run or jog and still talk in complete sentences.  What types of exercise should I not do during pregnancy? Depending on your level of fitness and whether you exercised regularly before your pregnancy, you may be  advised to limit vigorous-intensity exercise during your pregnancy. You can tell that you are exercising at a vigorous intensity if you are breathing much harder and faster and cannot hold a conversation while exercising. Some examples of exercising that you should avoid during pregnancy include:  Contact sports.  Activities that place you at risk for falling on or being hit in the belly, such as downhill skiing, water skiing, surfing, rock climbing, cycling, gymnastics, and horseback riding.  Scuba diving.  Sky diving.  Yoga or Pilates in a room that is heated to extreme temperatures ("hot yoga" or "hot Pilates").  Jogging or running, unless you ran or jogged regularly before your pregnancy. While jogging or running, you should always be able  to talk in full sentences. Do not run or jog so vigorously that you are unable to have a conversation.  If you are not used to exercising at elevation (more than 6,000 feet above sea level), do not do so during your pregnancy.  When should I avoid exercising during pregnancy? Certain medical conditions can make it unsafe to exercise during pregnancy, or they may increase your risk of miscarriage or early labor and birth. Some of these conditions include:  Some types of heart disease.  Some types of lung disease.  Placenta previa. This is when the placenta partially or completely covers the opening of the uterus (cervix).  Frequent bleeding from the vagina during your pregnancy.  Incompetent cervix. This is when your cervix does not remain as tightly closed during pregnancy as it should.  Premature labor.  Ruptured membranes. This is when the protective sac (amniotic sac) opens up and amniotic fluid leaks from your vagina.  Severely low blood count (anemia).  Preeclampsia or pregnancy-caused high blood pressure.  Carrying more than one baby (multiple gestation) and having an additional risk of early labor.  Poorly controlled diabetes.  Being severely underweight or severely overweight.  Intrauterine growth restriction. This is when your baby's growth and development during pregnancy are slower than expected.  Other medical conditions. Ask your health care provider if any apply to you.  What else should I know about exercising during pregnancy? You should take these precautions while exercising during pregnancy:  Avoid overheating. ? Wear loose-fitting, breathable clothes. ? Do not exercise in very high temperatures.  Avoid dehydration. Drink enough water before, during, and after exercise to keep your urine clear or pale yellow.  Avoid overstretching. Because of hormone changes during pregnancy, it is easy to overstretch muscles, tendons, and ligaments during  pregnancy.  Start slowly and ask your health care provider to recommend types of exercise that are safe for you, if exercising regularly is new for you.  Pregnancy is not a time for exercising to lose weight. When should I seek medical care? You should stop exercising and call your health care provider if you have any unusual symptoms, such as:  Mild uterine contractions or abdominal cramping.  Dizziness that does not improve with rest.  When should I seek immediate medical care? You should stop exercising and call your local emergency services (911 in the [place] if you have any unusual symptoms, such as:  Sudden, severe pain in your low back or your belly.  Uterine contractions or abdominal cramping that do not improve with rest.  Chest pain.  Bleeding or fluid leaking from your vagina.  Shortness of breath.  This information is not intended to replace advice given to you by your health care provider. Make sure you discuss any  questions you have with your health care provider. Document Released: 08/25/2005 Document Revised: 01/23/2016 Document Reviewed: 11/02/2014 Elsevier Interactive Patient Education  2018 [place] for Pregnant Women While you are pregnant, your body will require additional nutrition to help support your growing baby. It is recommended that you consume:  150 additional calories each day during your first trimester.  300 additional calories each day during your second trimester.  300 additional calories each day during your third trimester.  Eating a healthy, well-balanced diet is very important for your health and for your baby's health. You also have a higher need for some vitamins and minerals, such as folic acid, calcium, iron, and vitamin D. What do I need to know about eating during pregnancy?  Do not try to lose weight or go on a diet during pregnancy.  Choose healthy, nutritious foods. Choose  of a sandwich with a glass of milk  instead of a candy bar or a high-calorie sugar-sweetened beverage.  Limit your overall intake of foods that have "empty calories." These are foods that have little nutritional value, such as sweets, desserts, candies, sugar-sweetened beverages, and fried foods.  Eat a variety of foods, especially fruits and vegetables.  Take a prenatal vitamin to help meet the additional needs during pregnancy, specifically for folic acid, iron, calcium, and vitamin D.  Remember to stay active. Ask your health care provider for exercise recommendations that are specific to you.  Practice good food safety and cleanliness, such as washing your hands before you eat and after you prepare raw meat. This helps to prevent foodborne illnesses, such as listeriosis, that can be very dangerous for your baby. Ask your health care provider for more information about listeriosis. What does 150 extra calories look like? Healthy options for an additional 150 calories each day could be any of the following:  Plain low-fat yogurt (6-8 oz) with  cup of berries.  1 apple with 2 teaspoons of peanut butter.  Cut-up vegetables with  cup of hummus.  Low-fat chocolate milk (8 oz or 1 cup).  1 string cheese with 1 medium orange.   of a peanut butter and jelly sandwich on whole-wheat bread (1 tsp of peanut butter).  For 300 calories, you could eat two of those healthy options each day. What is a healthy amount of weight to gain? The recommended amount of weight for you to gain is based on your pre-pregnancy BMI. If your pre-pregnancy BMI was:  Less than 18 (underweight), you should gain 28-40 lb.  18-24.9 (normal), you should gain 25-35 lb.  25-29.9 (overweight), you should gain 15-25 lb.  Greater than 30 (obese), you should gain 11-20 lb.  What if I am having twins or multiples? Generally, pregnant women who will be having twins or multiples may need to increase their daily calories by 300-600 calories each day. The  recommended range for total weight gain is 25-54 lb, depending on your pre-pregnancy BMI. Talk with your health care provider for specific guidance about additional nutritional needs, weight gain, and exercise during your pregnancy. What foods can I eat? Grains Any grains. Try to choose whole grains, such as whole-wheat bread, oatmeal, or brown rice. Vegetables Any vegetables. Try to eat a variety of colors and types of vegetables to get a full range of vitamins and minerals. Remember to wash your vegetables well before eating. Fruits Any fruits. Try to eat a variety of colors and types of fruit to get a full range of vitamins and  minerals. Remember to wash your fruits well before eating. Meats and Other Protein Sources Lean meats, including chicken, [place], fish, and lean cuts of beef, veal, or pork. Make sure that all meats are cooked to "well done." Tofu. Tempeh. Beans. Eggs. Peanut butter and other nut butters. Seafood, such as shrimp, crab, and lobster. If you choose fish, select types that are higher in omega-3 fatty acids, including salmon, herring, mussels, trout, sardines, and pollock. Make sure that all meats are cooked to food-safe temperatures. Dairy Pasteurized milk and milk alternatives. Pasteurized yogurt and pasteurized cheese. Cottage cheese. Sour cream. Beverages Water. Juices that contain 100% fruit juice or vegetable juice. Caffeine-free teas and decaffeinated coffee. Drinks that contain caffeine are okay to drink, but it is better to avoid caffeine. Keep your total caffeine intake to less than 200 mg each day (12 oz of coffee, tea, or soda) or as directed by your health care provider. Condiments Any pasteurized condiments. Sweets and Desserts Any sweets and desserts. Fats and Oils Any fats and oils. The items listed above may not be a complete list of recommended foods or beverages. Contact your dietitian for more options. What foods are not  recommended? Vegetables Unpasteurized (raw) vegetable juices. Fruits Unpasteurized (raw) fruit juices. Meats and Other Protein Sources Cured meats that have nitrates, such as bacon, salami, and hotdogs. Luncheon meats, bologna, or other deli meats (unless they are reheated until they are steaming hot). Refrigerated pate, meat spreads from a meat counter, smoked seafood that is found in the refrigerated section of a store. Raw fish, such as sushi or sashimi. High mercury content fish, such as tilefish, shark, swordfish, and king mackerel. Raw meats, such as tuna or beef tartare. Undercooked meats and poultry. Make sure that all meats are cooked to food-safe temperatures. Dairy Unpasteurized (raw) milk and any foods that have raw milk in them. Soft cheeses, such as feta, queso blanco, queso fresco, Brie, Camembert cheeses, blue-veined cheeses, and Panela cheese (unless it is made with pasteurized milk, which must be stated on the label). Beverages Alcohol. Sugar-sweetened beverages, such as sodas, teas, or energy drinks. Condiments Homemade fermented foods and drinks, such as pickles, sauerkraut, or kombucha drinks. (Store-bought pasteurized versions of these are okay.) Other Salads that are made in the store, such as ham salad, chicken salad, egg salad, tuna salad, and seafood salad. The items listed above may not be a complete list of foods and beverages to avoid. Contact your dietitian for more information. This information is not intended to replace advice given to you by your health care provider. Make sure you discuss any questions you have with your health care provider. Document Released: 06/09/2014 Document Revised: 01/31/2016 Document Reviewed: 02/07/2014 Elsevier Interactive Patient Education  [employer].

## 2017-11-12 ENCOUNTER — Telehealth: Payer: Self-pay

## 2017-11-12 LAB — RPR+RH+ABO+RUB AB+AB SCR+CB...
ANTIBODY SCREEN: NEGATIVE
HEP B S AG: NEGATIVE
HIV SCREEN 4TH GENERATION: NONREACTIVE
Hematocrit: 39.9 % (ref 34.0–46.6)
Hemoglobin: 13.2 g/dL (ref 11.1–15.9)
MCH: 31.4 pg (ref 26.6–33.0)
MCHC: 33.1 g/dL (ref 31.5–35.7)
MCV: 95 fL (ref 79–97)
PLATELETS: 227 10*3/uL (ref 150–379)
RBC: 4.2 x10E6/uL (ref 3.77–5.28)
RDW: 13 % (ref 12.3–15.4)
RPR: NONREACTIVE
Rh Factor: POSITIVE
Rubella Antibodies, IGG: <0.9 index — ABNORMAL LOW (ref >0.99)
Varicella zoster IgG: 639 index (ref >165)
WBC: 6.6 10*3/uL (ref 3.4–10.8)

## 2017-11-12 NOTE — Telephone Encounter (Signed)
Pt called to request work note for office visit yesterday to send through MyChart

## 2017-11-13 ENCOUNTER — Telehealth: Payer: Self-pay

## 2017-11-13 LAB — CHLAMYDIA/GONOCOCCUS/TRICHOMONAS, NAA
Chlamydia by NAA: NEGATIVE
GONOCOCCUS BY NAA: NEGATIVE
TRICH VAG BY NAA: NEGATIVE

## 2017-11-13 LAB — URINE CULTURE

## 2017-11-13 NOTE — Telephone Encounter (Signed)
Spoke with patient regarding lab results. 

## 2017-11-13 NOTE — Telephone Encounter (Signed)
Pt calling to get lab results, please advise

## 2017-11-27 ENCOUNTER — Encounter: Payer: Managed Care, Other (non HMO) | Admitting: Advanced Practice Midwife

## 2017-11-27 ENCOUNTER — Other Ambulatory Visit: Payer: Managed Care, Other (non HMO)

## 2017-12-10 ENCOUNTER — Encounter: Payer: Self-pay | Admitting: Advanced Practice Midwife

## 2017-12-10 ENCOUNTER — Other Ambulatory Visit: Payer: Self-pay | Admitting: Advanced Practice Midwife

## 2017-12-10 ENCOUNTER — Ambulatory Visit (INDEPENDENT_AMBULATORY_CARE_PROVIDER_SITE_OTHER): Payer: Managed Care, Other (non HMO)

## 2017-12-10 ENCOUNTER — Ambulatory Visit (INDEPENDENT_AMBULATORY_CARE_PROVIDER_SITE_OTHER): Payer: Managed Care, Other (non HMO) | Admitting: Advanced Practice Midwife

## 2017-12-10 VITALS — BP 116/78 | Wt 198.0 lb

## 2017-12-10 DIAGNOSIS — Z3480 Encounter for supervision of other normal pregnancy, unspecified trimester: Secondary | ICD-10-CM

## 2017-12-10 DIAGNOSIS — O30041 Twin pregnancy, dichorionic/diamniotic, first trimester: Secondary | ICD-10-CM

## 2017-12-10 DIAGNOSIS — O30049 Twin pregnancy, dichorionic/diamniotic, unspecified trimester: Secondary | ICD-10-CM | POA: Insufficient documentation

## 2017-12-10 DIAGNOSIS — Z3A08 8 weeks gestation of pregnancy: Secondary | ICD-10-CM

## 2017-12-10 NOTE — Progress Notes (Signed)
ROB Dating Scan/ It is twins

## 2017-12-10 NOTE — Progress Notes (Signed)
  Routine Prenatal Care Visit  Subjective  Angela Braun is a 32 y.o. G2P1001 at 48w5dbeing seen today for ongoing prenatal care.  She is currently monitored for the following issues for this high-risk pregnancy and has Supervision of other normal pregnancy, antepartum on their problem list.  ----------------------------------------------------------------------------------- Patient reports mild nausea. Unisom and B6 are helping. Constipation- taking stool softener every other day.    . Vag. Bleeding: None.   . Denies leaking of fluid.  ----------------------------------------------------------------------------------- The following portions of the patient's history were reviewed and updated as appropriate: allergies, current medications, past family history, past medical history, past social history, past surgical history and problem list. Problem list updated.   Objective  Blood pressure 116/78, weight 198 lb (89.8 kg), last menstrual period 10/10/2017. Pregravid weight 196 lb (88.9 kg) Total Weight Gain 2 lb (0.907 kg) Urinalysis:      Fetal Status: dating agrees with LMP DKathi Dertwins on ultrasound dating today Baby A: 844w6dith heart rate 168 [name]: 9w62w0dth heart rate 170  General:  Alert, oriented and cooperative. Patient is in no acute distress.  Skin: Skin is warm and dry. No rash noted.   Cardiovascular: Normal heart rate noted  Respiratory: Normal respiratory effort, no problems with respiration noted  Abdomen: Soft, gravid, appropriate for gestational age.       Pelvic:  Cervical exam deferred        Extremities: Normal range of motion.     Mental Status: Normal mood and affect. Normal behavior. Normal judgment and thought content.   Assessment   32 39o. G2P1001 at [id number] 07/17/2018, by Last Menstrual Period presenting for routine prenatal visit  Plan   pregnancy Problems (from 11/11/17 to present)    No problems associated with this episode.       Preterm  labor symptoms and general obstetric precautions including but not limited to vaginal bleeding, contractions, leaking of fluid and fetal movement were reviewed in detail with the patient.  MaterniT 21 at next visit Return in about 1 month (around 01/07/2018) for rob.  JaneRod CanM 12/10/2017 10:37 AM

## 2018-01-07 ENCOUNTER — Encounter: Payer: Self-pay | Admitting: Advanced Practice Midwife

## 2018-01-07 ENCOUNTER — Ambulatory Visit (INDEPENDENT_AMBULATORY_CARE_PROVIDER_SITE_OTHER): Payer: Managed Care, Other (non HMO) | Admitting: Advanced Practice Midwife

## 2018-01-07 VITALS — BP 122/74 | Wt 201.0 lb

## 2018-01-07 DIAGNOSIS — Z3A12 12 weeks gestation of pregnancy: Secondary | ICD-10-CM

## 2018-01-07 NOTE — Patient Instructions (Signed)

## 2018-01-07 NOTE — Progress Notes (Signed)
No vb. No lof.  

## 2018-01-07 NOTE — Progress Notes (Signed)
  Routine Prenatal Care Visit  Subjective  Angela Braun is a 32 y.o. G2P1001 at 55w5dbeing seen today for ongoing prenatal care.  She is currently monitored for the following issues for this high-risk pregnancy and has Supervision of high-risk pregnancy and Dichorionic diamniotic twin pregnancy, antepartum on their problem list.  ----------------------------------------------------------------------------------- Patient reports a few headaches, some mild bilateral cramps.   Contractions: Not present. Vag. Bleeding: None.   . Denies leaking of fluid.  ----------------------------------------------------------------------------------- The following portions of the patient's history were reviewed and updated as appropriate: allergies, current medications, past family history, past medical history, past social history, past surgical history and problem list. Problem list updated.   Objective  Blood pressure 122/74, weight 201 lb (91.2 kg), last menstrual period 10/10/2017. Pregravid weight 196 lb (88.9 kg) Total Weight Gain 5 lb (2.268 kg) Urinalysis: Urine Protein: Negative Urine Glucose: Negative  Fetal Status: Fetal Heart Rate (bpm): 158         Fetal heart rates: low to mid 150s maternal right and mid to high 150s maternal left  General:  Alert, oriented and cooperative. Patient is in no acute distress.  Skin: Skin is warm and dry. No rash noted.   Cardiovascular: Normal heart rate noted  Respiratory: Normal respiratory effort, no problems with respiration noted  Abdomen: Soft, gravid, appropriate for gestational age. Pain/Pressure: Absent     Pelvic:  Cervical exam deferred        Extremities: Normal range of motion.  Edema: None  Mental Status: Normal mood and affect. Normal behavior. Normal judgment and thought content.   Assessment   33y.o. G2P1001 at 170w5dy  07/17/2018, by Last Menstrual Period presenting for routine prenatal visit  Plan   pregnancy Problems (from  11/11/17 to present)    Problem Noted Resolved   Supervision of high-risk pregnancy 11/11/2017 by GlRod CanCNM No   Overview Signed 11/11/2017 10:00 AM by GlRod CanCNWest [facility]atal [facility]  Dating  Blood type:     Genetic Screen 1 Screen:    AFP:     Quad:     NIPS: Antibody:   Anatomic US[place]oreaRubella:   Varicella: @VZVIGG @  GTT Early:               Third trimester:  RPR:     Rhogam  HBsAg:     TDaP vaccine                       Flu Shot: HIV:     Baby Food                                GBS:   Contraception  Pap:  CBB     CS/VBAC NA   Support Person Husband Jason              Preterm labor symptoms and general obstetric precautions including but not limited to vaginal bleeding, contractions, leaking of fluid and fetal movement were reviewed in detail with the patient. Please refer to After Visit Summary for other counseling recommendations.  Encouraged healthy diet and continued physical activity Panorama today  Return in about 1 month (around 02/04/2018) for rob.  JaRod CanCNM 01/07/2018 3:12 PM

## 2018-01-08 ENCOUNTER — Encounter: Payer: Managed Care, Other (non HMO) | Admitting: Advanced Practice Midwife

## 2018-01-13 ENCOUNTER — Telehealth: Payer: Self-pay

## 2018-01-13 NOTE — Telephone Encounter (Signed)
[facility] calling to request missing information for panorama test. Lab needs to verify test panel ordered and pt's EDD. Reference case # [id number] Please call Avelina Laine at [phone] to update. Thank you.

## 2018-01-15 NOTE — Telephone Encounter (Signed)
Missing info has been  given to [employer].

## 2018-01-20 ENCOUNTER — Telehealth: Payer: Self-pay

## 2018-01-20 NOTE — Telephone Encounter (Signed)
Spoke with patient and told her results of Panorama are not back yet and that I will call her when they do.

## 2018-01-20 NOTE — Telephone Encounter (Signed)
Pt calling regarding lab results from last visit with JEG. I do not see them in chart, but was not sure if they were back and someone had given them to you to review. Please advise pt when results are back.

## 2018-01-21 NOTE — Telephone Encounter (Signed)
Pt aware of results and of gender of babies

## 2018-02-05 ENCOUNTER — Ambulatory Visit (INDEPENDENT_AMBULATORY_CARE_PROVIDER_SITE_OTHER): Payer: Managed Care, Other (non HMO) | Admitting: Obstetrics & Gynecology

## 2018-02-05 VITALS — BP 100/70 | Wt 202.0 lb

## 2018-02-05 DIAGNOSIS — Z3A16 16 weeks gestation of pregnancy: Secondary | ICD-10-CM

## 2018-02-05 DIAGNOSIS — O0992 Supervision of high risk pregnancy, unspecified, second trimester: Secondary | ICD-10-CM

## 2018-02-05 DIAGNOSIS — O30042 Twin pregnancy, dichorionic/diamniotic, second trimester: Secondary | ICD-10-CM

## 2018-02-05 NOTE — Progress Notes (Signed)
  Subjective  Fetal Movement? yes Contractions? no Leaking Fluid? no Vaginal Bleeding? no LBP at times Objective  BP 100/70   Wt 202 lb (91.6 kg)   LMP 10/10/2017   BMI 30.71 kg/m  General: NAD Pumonary: no increased work of breathing Abdomen: gravid, non-tender Extremities: no edema Psychiatric: mood appropriate, affect full  Assessment  32 y.o. G2P1001 at 46w6dby  07/17/2018, by Last Menstrual Period presenting for routine prenatal visit  Plan   Problem List Items Addressed This Visit      Other   Supervision of high-risk pregnancy   Dichorionic diamniotic twin pregnancy, antepartum    Other Visit Diagnoses    [redacted] weeks gestation of pregnancy    -  Primary     [facility]  Dating [place] type:     Genetic Screen   NIPS: Normal, XX and XY Antibody:   Anatomic [place] Rubella:   Varicella: @VZVIGG @  GTT   Third trimester:  RPR:     Rhogam  HBsAg:     TDaP vaccine  28 wks  Flu Shot:declined HIV:     Baby Food                                GBS:   Contraception  Pap:  CBB     CS/VBAC NA   Support Person Husband Jason   [place]at nv   PBarnett Applebaum MD, FLoura PardonOb/Gyn, CLakeland VillageGroup 02/05/2018  11:00 AM

## 2018-03-04 ENCOUNTER — Ambulatory Visit (INDEPENDENT_AMBULATORY_CARE_PROVIDER_SITE_OTHER): Payer: Managed Care, Other (non HMO) | Admitting: Advanced Practice Midwife

## 2018-03-04 ENCOUNTER — Encounter: Payer: Self-pay | Admitting: Advanced Practice Midwife

## 2018-03-04 ENCOUNTER — Ambulatory Visit (INDEPENDENT_AMBULATORY_CARE_PROVIDER_SITE_OTHER): Payer: Managed Care, Other (non HMO)

## 2018-03-04 ENCOUNTER — Other Ambulatory Visit: Payer: Self-pay | Admitting: Obstetrics & Gynecology

## 2018-03-04 VITALS — BP 102/62 | Wt 209.0 lb

## 2018-03-04 DIAGNOSIS — O30042 Twin pregnancy, dichorionic/diamniotic, second trimester: Secondary | ICD-10-CM

## 2018-03-04 DIAGNOSIS — O0992 Supervision of high risk pregnancy, unspecified, second trimester: Secondary | ICD-10-CM

## 2018-03-04 DIAGNOSIS — Z363 Encounter for antenatal screening for malformations: Secondary | ICD-10-CM | POA: Diagnosis not present

## 2018-03-04 DIAGNOSIS — O30049 Twin pregnancy, dichorionic/diamniotic, unspecified trimester: Secondary | ICD-10-CM

## 2018-03-04 DIAGNOSIS — Z3A20 20 weeks gestation of pregnancy: Secondary | ICD-10-CM

## 2018-03-04 NOTE — Progress Notes (Signed)
  Routine Prenatal Care Visit  Subjective  Angela Braun is a 32 y.o. G2P1001 at 33w5dbeing seen today for ongoing prenatal care.  She is currently monitored for the following issues for this high-risk pregnancy and has Supervision of high-risk pregnancy and Dichorionic diamniotic twin pregnancy, antepartum on their problem list.  ----------------------------------------------------------------------------------- Patient reports no complaints.   Contractions: Not present. Vag. Bleeding: None.  Movement: Present. Denies leaking of fluid.  ----------------------------------------------------------------------------------- The following portions of the patient's history were reviewed and updated as appropriate: allergies, current medications, past family history, past medical history, past social history, past surgical history and problem list. Problem list updated.   Objective  Blood pressure 102/62, weight 209 lb (94.8 kg), last menstrual period 10/10/2017. Pregravid weight 196 lb (88.9 kg) Total Weight Gain 13 lb (5.897 kg) Urinalysis:      Fetal Status: Fetal Heart Rate (bpm): 143   Movement: Present  Presentation: Transverse  Twin A: female, vertex on maternal left, 14 ounces, anatomy normal and incomplete for RVOT/LVOT Twin B: female, transverse (head on left), 14 ounces, anatomy normal and incomplete for RVOT/LVOT  General:  Alert, oriented and cooperative. Patient is in no acute distress.  Skin: Skin is warm and dry. No rash noted.   Cardiovascular: Normal heart rate noted  Respiratory: Normal respiratory effort, no problems with respiration noted  Abdomen: Soft, gravid, appropriate for gestational age. Pain/Pressure: Absent     Pelvic:  Cervical exam deferred        Extremities: Normal range of motion.  Edema: None  Mental Status: Normal mood and affect. Normal behavior. Normal judgment and thought content.   Assessment   32y.o. G2P1001 at 277w5dy  07/17/2018, by Last  Menstrual Period presenting for routine prenatal visit  Plan   pregnancy Problems (from 11/11/17 to present)    Problem Noted Resolved   Supervision of high-risk pregnancy 11/11/2017 by GlRod CanCNM No   Overview Addendum 02/05/2018 10:39 AM by HaGae DryMD    [facility]  Dating US[place] type:     Genetic Screen   NIPS: Normal, XX and XY Antibody:   Anatomic US[place]Rubella:   Varicella: @VZVIGG @  GTT   Third trimester:  RPR:     Rhogam  HBsAg:     TDaP vaccine                       Flu Shot: HIV:     Baby Food                                GBS:   Contraception  Pap:  CBB     CS/VBAC NA   Support Person [name]              Preterm labor symptoms and general obstetric precautions including but not limited to vaginal bleeding, contractions, leaking of fluid and fetal movement were reviewed in detail with the patient.   Return in about 1 month (around 04/01/2018) for follow up anatomy twins/rob.  JaRod CanCNM 03/04/2018 3:51 PM

## 2018-04-02 ENCOUNTER — Ambulatory Visit (INDEPENDENT_AMBULATORY_CARE_PROVIDER_SITE_OTHER): Payer: Managed Care, Other (non HMO) | Admitting: Maternal Newborn

## 2018-04-02 ENCOUNTER — Encounter: Payer: Self-pay | Admitting: Maternal Newborn

## 2018-04-02 ENCOUNTER — Ambulatory Visit (INDEPENDENT_AMBULATORY_CARE_PROVIDER_SITE_OTHER): Payer: Managed Care, Other (non HMO)

## 2018-04-02 VITALS — BP 110/78 | Wt 217.0 lb

## 2018-04-02 DIAGNOSIS — R12 Heartburn: Secondary | ICD-10-CM

## 2018-04-02 DIAGNOSIS — O0992 Supervision of high risk pregnancy, unspecified, second trimester: Secondary | ICD-10-CM

## 2018-04-02 DIAGNOSIS — O30043 Twin pregnancy, dichorionic/diamniotic, third trimester: Secondary | ICD-10-CM

## 2018-04-02 DIAGNOSIS — O26892 Other specified pregnancy related conditions, second trimester: Secondary | ICD-10-CM

## 2018-04-02 DIAGNOSIS — O30049 Twin pregnancy, dichorionic/diamniotic, unspecified trimester: Secondary | ICD-10-CM

## 2018-04-02 DIAGNOSIS — Z3A24 24 weeks gestation of pregnancy: Secondary | ICD-10-CM | POA: Diagnosis not present

## 2018-04-02 DIAGNOSIS — O30042 Twin pregnancy, dichorionic/diamniotic, second trimester: Secondary | ICD-10-CM

## 2018-04-02 MED ORDER — RANITIDINE HCL 150 MG PO TABS: 150.0000 mg | ORAL_TABLET | Freq: Two times a day (BID) | ORAL | 11 refills | Status: DC

## 2018-04-02 NOTE — Progress Notes (Signed)
C/o occ pain right side - thinks maybe it's the baby and nothing serious.rj

## 2018-04-02 NOTE — Progress Notes (Signed)
    Routine Prenatal Care Visit  Subjective  Angela Braun is a 32 y.o. G2P1001 at 108w6d being seen today for ongoing prenatal care.  She is currently monitored for the following issues for this high-risk pregnancy and has Supervision of high-risk pregnancy and Dichorionic diamniotic twin pregnancy, antepartum on their problem list.  ----------------------------------------------------------------------------------- Patient reports heartburn. She also has occasional pain on her right side that she feels is probably due to positioning of the babies. Contractions: Not present. Vag. Bleeding: None.  Movement: Present. No leaking of fluid.  ----------------------------------------------------------------------------------- The following portions of the patient's history were reviewed and updated as appropriate: allergies, current medications, past family history, past medical history, past social history, past surgical history and problem list. Problem list updated.   Objective  Blood pressure 110/78, weight 217 lb (98.4 kg), last menstrual period 10/10/2017. Pregravid weight 196 lb (88.9 kg) Total Weight Gain 21 lb (9.526 kg) Urinalysis: Urine Protein: Trace Urine Glucose: Trace  Fetal Status: Fetal Heart Rate (bpm): 140, 136   Movement: Present     General:  Alert, oriented and cooperative. Patient is in no acute distress.  Skin: Skin is warm and dry. No rash noted.   Cardiovascular: Normal heart rate noted  Respiratory: Normal respiratory effort, no problems with respiration noted  Abdomen: Soft, gravid, appropriate for gestational age. Pain/Pressure: Absent     Pelvic:  Cervical exam deferred        Extremities: Normal range of motion.  Edema: None  Mental Status: Normal mood and affect. Normal behavior. Normal judgment and thought content.     Assessment   32 y.o. G2P1001 at [redacted]w[redacted]d, EDD 07/17/2018 by Last Menstrual Period presenting for routine prenatal visit.  Plan   pregnancy  Problems (from 11/11/17 to present)    Problem Noted Resolved   Supervision of high-risk pregnancy 11/11/2017 by Tresea Mall, CNM No   Overview Addendum 02/05/2018 10:39 AM by Nadara Mustard, MD    [facility]  Dating [place] Blood type:     Genetic Screen   NIPS: Normal, XX and XY Antibody:   Anatomic [place]  Rubella:   Varicella: @VZVIGG @  GTT   Third trimester:  RPR:     Rhogam  HBsAg:     TDaP vaccine                       Flu Shot: HIV:     Baby Food                                GBS:   Contraception  Pap:  CBB     CS/VBAC NA   Support Person Husband Barbara Cower           Anatomy scan complete and normal today for both twins. Baby A is breech and [name] is transverse at this time.  Rx sent for Zantac, she will let us know if it is effective.   Gestational age appropriate obstetric precautions were reviewed.  Return in about 3 weeks (around 04/23/2018) for ROB with GTT/28 week labs.  Marcelyn Bruins, CNM 04/02/2018  10:43 AM

## 2018-04-22 ENCOUNTER — Other Ambulatory Visit: Payer: Managed Care, Other (non HMO)

## 2018-04-22 ENCOUNTER — Ambulatory Visit (INDEPENDENT_AMBULATORY_CARE_PROVIDER_SITE_OTHER): Payer: Managed Care, Other (non HMO) | Admitting: Obstetrics and Gynecology

## 2018-04-22 ENCOUNTER — Encounter: Payer: Self-pay | Admitting: Obstetrics and Gynecology

## 2018-04-22 VITALS — BP 108/66 | Wt 225.0 lb

## 2018-04-22 DIAGNOSIS — Z23 Encounter for immunization: Secondary | ICD-10-CM | POA: Diagnosis not present

## 2018-04-22 DIAGNOSIS — O0993 Supervision of high risk pregnancy, unspecified, third trimester: Secondary | ICD-10-CM

## 2018-04-22 DIAGNOSIS — O30049 Twin pregnancy, dichorionic/diamniotic, unspecified trimester: Secondary | ICD-10-CM

## 2018-04-22 DIAGNOSIS — Z3A27 27 weeks gestation of pregnancy: Secondary | ICD-10-CM | POA: Diagnosis not present

## 2018-04-22 LAB — POCT URINALYSIS DIPSTICK OB
Glucose, UA: NEGATIVE — AB
PROTEIN: NEGATIVE

## 2018-04-22 NOTE — Progress Notes (Signed)
ROB 28 week labs today Headaches FLU vaccine

## 2018-04-22 NOTE — Progress Notes (Signed)
Routine Prenatal Care Visit  Subjective  Angela Braun is a 32 y.o. G2P1001 at 43w5dbeing seen today for ongoing prenatal care.  She is currently monitored for the following issues for this high-risk pregnancy and has Supervision of high-risk pregnancy and Dichorionic diamniotic twin pregnancy, antepartum on their problem list.  ----------------------------------------------------------------------------------- Patient reports headache and right sided pain by ribcage worse after a long active day..   Contractions: Not present. Vag. Bleeding: None.  Movement: Present. Denies leaking of fluid.  ----------------------------------------------------------------------------------- The following portions of the patient's history were reviewed and updated as appropriate: allergies, current medications, past family history, past medical history, past social history, past surgical history and problem list. Problem list updated.   Objective  Blood pressure 108/66, weight 225 lb (102.1 kg), last menstrual period 10/10/2017. Pregravid weight 196 lb (88.9 kg) Total Weight Gain 29 lb (13.2 kg) Urinalysis:      Fetal Status: Fetal Heart Rate (bpm): 125,140 Fundal Height: 140 cm Movement: Present     General:  Alert, oriented and cooperative. Patient is in no acute distress.  Skin: Skin is warm and dry. No rash noted.   Cardiovascular: Normal heart rate noted  Respiratory: Normal respiratory effort, no problems with respiration noted  Abdomen: Soft, gravid, appropriate for gestational age. Pain/Pressure: Present     Pelvic:  Cervical exam deferred        Extremities: Normal range of motion.     ental Status: Normal mood and affect. Normal behavior. Normal judgment and thought content.     Assessment   32y.o. G2P1001 at 260w5dy  07/17/2018, by Last Menstrual Period presenting for routine prenatal visit  Plan   pregnancy Problems (from 11/11/17 to present)    Problem Noted Resolved   Supervision of high-risk pregnancy 11/11/2017 by GlRod CanCNM No   Overview Addendum 04/22/2018  9:10 AM by ScHomero FellersMD    [facility]  Dating US[place]ood type: O/Positive/-- (03/06 091610  Genetic Screen   NIPS: Normal, XX and XY Antibody:Negative (03/06 0952)  Anatomic US[place]oreaomplete Rubella: <0.90 (03/06 0952) Varicella: Immune  GTT   Third trimester:  RPR: Non Reactive (03/06 0952)   Rhogam  not needed HBsAg: Negative (03/06 0952)   TDaP vaccine                        Flu Shot: 04/22/18 HIV: Non Reactive (03/06 0952)   Baby Food   breast                             GBS:   Contraception  given information Pap: 2017  normal  CBB     CS/VBAC NA   Support Person Husband JaCorene Cornea            Gestational age appropriate obstetric precautions including but not limited to vaginal bleeding, contractions, leaking of fluid and fetal movement were reviewed in detail with the patient.    Growth US[place]oreat next visit [facility] Given information on birthcontrol options postpartum. Recommended [email or web address].  Given information on volunteer doula program at the hospital.  Discussed breast feeding. Patient is planning on breast feeding. Patient was given resources and lactation consultation was advised.  Flu shot today. 28 week labs today.   Return in about 2 weeks (around 05/06/2018) for ROHollandnd US[place] ChAdrian ProwsD  [place], [facility] 04/22/18 9:08 AM

## 2018-04-23 LAB — 28 WEEK RH+PANEL
Basophils Absolute: 0 10*3/uL (ref 0.0–0.2)
Basos: 0 %
EOS (ABSOLUTE): 0.1 10*3/uL (ref 0.0–0.4)
Eos: 1 %
GESTATIONAL DIABETES SCREEN: 120 mg/dL (ref 65–139)
HIV Screen 4th Generation wRfx: NONREACTIVE
Hematocrit: 34.8 % (ref 34.0–46.6)
Hemoglobin: 11.7 g/dL (ref 11.1–15.9)
IMMATURE GRANS (ABS): 0.1 10*3/uL (ref 0.0–0.1)
IMMATURE GRANULOCYTES: 1 %
LYMPHS: 13 %
Lymphocytes Absolute: 1.2 10*3/uL (ref 0.7–3.1)
MCH: 31.8 pg (ref 26.6–33.0)
MCHC: 33.6 g/dL (ref 31.5–35.7)
MCV: 95 fL (ref 79–97)
MONOS ABS: 0.4 10*3/uL (ref 0.1–0.9)
Monocytes: 4 %
NEUTROS PCT: 81 %
Neutrophils Absolute: 7.2 10*3/uL — ABNORMAL HIGH (ref 1.4–7.0)
Platelets: 225 10*3/uL (ref 150–450)
RBC: 3.68 x10E6/uL — AB (ref 3.77–5.28)
RDW: 13.4 % (ref 12.3–15.4)
RPR: NONREACTIVE
WBC: 8.9 10*3/uL (ref 3.4–10.8)

## 2018-05-06 ENCOUNTER — Encounter: Payer: Self-pay | Admitting: Maternal Newborn

## 2018-05-06 ENCOUNTER — Other Ambulatory Visit: Payer: Self-pay | Admitting: Obstetrics and Gynecology

## 2018-05-06 ENCOUNTER — Ambulatory Visit (INDEPENDENT_AMBULATORY_CARE_PROVIDER_SITE_OTHER): Payer: Managed Care, Other (non HMO)

## 2018-05-06 ENCOUNTER — Ambulatory Visit (INDEPENDENT_AMBULATORY_CARE_PROVIDER_SITE_OTHER): Payer: Managed Care, Other (non HMO) | Admitting: Maternal Newborn

## 2018-05-06 VITALS — BP 100/70 | Wt 228.5 lb

## 2018-05-06 DIAGNOSIS — O0993 Supervision of high risk pregnancy, unspecified, third trimester: Secondary | ICD-10-CM

## 2018-05-06 DIAGNOSIS — Z3A29 29 weeks gestation of pregnancy: Secondary | ICD-10-CM

## 2018-05-06 DIAGNOSIS — O26893 Other specified pregnancy related conditions, third trimester: Secondary | ICD-10-CM | POA: Insufficient documentation

## 2018-05-06 DIAGNOSIS — O30049 Twin pregnancy, dichorionic/diamniotic, unspecified trimester: Secondary | ICD-10-CM

## 2018-05-06 DIAGNOSIS — O30043 Twin pregnancy, dichorionic/diamniotic, third trimester: Secondary | ICD-10-CM

## 2018-05-06 DIAGNOSIS — R12 Heartburn: Secondary | ICD-10-CM

## 2018-05-06 LAB — POCT URINALYSIS DIPSTICK OB: GLUCOSE, UA: NEGATIVE

## 2018-05-06 MED ORDER — OMEPRAZOLE 40 MG PO CPDR: 40.0000 mg | DELAYED_RELEASE_CAPSULE | Freq: Every day | ORAL | 4 refills | Status: DC

## 2018-05-06 NOTE — Progress Notes (Signed)
    Routine Prenatal Care Visit  Subjective  Angela Braun is a 32 y.o. G2P1001 at [redacted]w[redacted]d being seen today for ongoing prenatal care.  She is currently monitored for the following issues for this high-risk pregnancy and has Supervision of high-risk pregnancy and Dichorionic diamniotic twin pregnancy, antepartum on their problem list.  ----------------------------------------------------------------------------------- Patient reports heartburn. It has gotten more severe and Zantac is no longer relieving it. Contractions: Not present. Vag. Bleeding: None.  Movement: Present. No leaking of fluid.  ----------------------------------------------------------------------------------- The following portions of the patient's history were reviewed and updated as appropriate: allergies, current medications, past family history, past medical history, past social history, past surgical history and problem list. Problem list updated.  Objective  Blood pressure 100/70, weight 228 lb 8 oz (103.6 kg), last menstrual period 10/10/2017. Pregravid weight 196 lb (88.9 kg) Total Weight Gain 32 lb 8 oz (14.7 kg)   Urinalysis: Protein Trace, Glucose Negative  Fetal Status: Fetal Heart Rate (bpm): 157, 138   Movement: Present     General:  Alert, oriented and cooperative. Patient is in no acute distress.  Skin: Skin is warm and dry. No rash noted.   Cardiovascular: Normal heart rate noted  Respiratory: Normal respiratory effort, no problems with respiration noted  Abdomen: Soft, gravid, appropriate for gestational age. Pain/Pressure: Present     Pelvic:  Cervical exam deferred        Extremities: Normal range of motion.  Edema: None  Mental Status: Normal mood and affect. Normal behavior. Normal judgment and thought content.    Assessment   32 y.o. G2P1001 at [redacted]w[redacted]d, EDD 07/17/2018 by Last Menstrual Period presenting for routine prenatal visit.  Plan   pregnancy Problems (from 11/11/17 to present)    Problem Noted Resolved   Supervision of high-risk pregnancy 11/11/2017 by Angela Braun, CNM No   Overview Addendum 04/22/2018  9:10 AM by Natale Milch, MD    [facility]  Dating [place] Blood type: O/Positive/-- (03/06 1610)   Genetic Screen   NIPS: Normal, XX and XY Antibody:Negative (03/06 0952)  Anatomic [place] complete Rubella: <0.90 (03/06 0952) Varicella: Immune  GTT   Third trimester:  RPR: Non Reactive (03/06 0952)   Rhogam  not needed HBsAg: Negative (03/06 0952)   TDaP vaccine                        Flu Shot: 04/22/18 HIV: Non Reactive (03/06 0952)   Baby Food   breast                             GBS:   Contraception  given information Pap: 2017  normal  CBB     CS/VBAC NA   Support Person [name] scan today: Baby A is at the 52nd percentile, EFW 3 lb, 3 oz. [name] is at the 56th percentile, EFW 3 lb 5 oz.    Will try Prilosec for increasing heartburn symptoms. Rx sent.  Return in about 2 weeks (around 05/20/2018) for ROB.  Marcelyn Bruins, CNM 05/06/2018  9:31 AM

## 2018-05-06 NOTE — Progress Notes (Signed)
C/O heartburn and indigestion is 10x worse.rj

## 2018-05-20 ENCOUNTER — Encounter: Payer: Self-pay | Admitting: Advanced Practice Midwife

## 2018-05-20 ENCOUNTER — Ambulatory Visit (INDEPENDENT_AMBULATORY_CARE_PROVIDER_SITE_OTHER): Payer: Managed Care, Other (non HMO) | Admitting: Advanced Practice Midwife

## 2018-05-20 VITALS — BP 118/78 | Wt 239.0 lb

## 2018-05-20 DIAGNOSIS — Z23 Encounter for immunization: Secondary | ICD-10-CM

## 2018-05-20 DIAGNOSIS — O30043 Twin pregnancy, dichorionic/diamniotic, third trimester: Secondary | ICD-10-CM

## 2018-05-20 DIAGNOSIS — O0993 Supervision of high risk pregnancy, unspecified, third trimester: Secondary | ICD-10-CM

## 2018-05-20 DIAGNOSIS — Z3A31 31 weeks gestation of pregnancy: Secondary | ICD-10-CM

## 2018-05-20 LAB — POCT URINALYSIS DIPSTICK OB: GLUCOSE, UA: NEGATIVE

## 2018-05-20 MED ORDER — TETANUS-DIPHTH-ACELL PERTUSSIS 5-2.5-18.5 LF-MCG/0.5 IM SUSP
0.5000 mL | Freq: Once | INTRAMUSCULAR | Status: AC
  Administered 2018-05-20: 0.5 mL via INTRAMUSCULAR

## 2018-05-20 NOTE — Progress Notes (Signed)
Routine Prenatal Care Visit  Subjective  Angela Braun is a 32 y.o. G2P1001 at 13w5dbeing seen today for ongoing prenatal care.  She is currently monitored for the following issues for this high-risk pregnancy and has Supervision of high-risk pregnancy; Dichorionic diamniotic twin pregnancy, antepartum; and Heartburn during pregnancy, third trimester on their problem list.  ----------------------------------------------------------------------------------- Patient reports some pelvic pressure and occasional mild cramps. Also mild lower extremity swelling especially at the end of the day. Reviewed comfort measures. Encouraged increased hydration, stretching, position changes, elevate legs.  Continued discussion on plan/timing of delivery. She desires vaginal delivery around 39 weeks if possible.  Contractions: Not present. Vag. Bleeding: None.  Movement: Present. Denies leaking of fluid.  ----------------------------------------------------------------------------------- The following portions of the patient's history were reviewed and updated as appropriate: allergies, current medications, past family history, past medical history, past social history, past surgical history and problem list. Problem list updated.   Objective  Blood pressure 118/78, weight 239 lb (108.4 kg), last menstrual period 10/10/2017. Pregravid weight 196 lb (88.9 kg) Total Weight Gain 43 lb (19.5 kg) Urinalysis: Urine Protein Trace  Urine Glucose Negative  Fetal Status: Fetal Heart Rate (bpm): 142, 127 Fundal Height: 41 cm Movement: Present     At last scan Baby A was maternal left and vertex, [name] was maternal right and transverse. Growth scan at next visit  General:  Alert, oriented and cooperative. Patient is in no acute distress.  Skin: Skin is warm and dry. No rash noted.   Cardiovascular: Normal heart rate noted  Respiratory: Normal respiratory effort, no problems with respiration noted  Abdomen: Soft,  gravid, appropriate for gestational age. Pain/Pressure: Present     Pelvic:  Cervical exam deferred        Extremities: Normal range of motion.  Edema: Trace  Mental Status: Normal mood and affect. Normal behavior. Normal judgment and thought content.   Assessment   32y.o. G2P1001 at 351w5dy  07/17/2018, by Last Menstrual Period presenting for routine prenatal visit  Plan   pregnancy Problems (from 11/11/17 to present)    Problem Noted Resolved   Supervision of high-risk pregnancy 11/11/2017 by GlRod CanCNM No   Overview Addendum 04/22/2018  9:10 AM by ScHomero FellersMD    [facility]  Dating [place] type: O/Positive/-- (03/06 093825  Genetic Screen   NIPS: Normal, XX and XY Antibody:Negative (03/06 0952)  Anatomic US[place]omplete Rubella: <0.90 (03/06 0952) Varicella: Immune  GTT   Third trimester:  RPR: Non Reactive (03/06 0952)   Rhogam  not needed HBsAg: Negative (03/06 0952)   TDaP vaccine                        Flu Shot: 04/22/18 HIV: Non Reactive (03/06 0952)   Baby Food   breast                             GBS:   Contraception  given information Pap: 2017  normal  CBB     CS/VBAC NA   Support Person [name]            Preterm labor symptoms and general obstetric precautions including but not limited to vaginal bleeding, contractions, leaking of fluid and fetal movement were reviewed in detail with the patient.   Return in about 2 weeks (around 06/03/2018) for growth scan and rob.  Rod Can, CNM 05/20/2018 8:51 AM

## 2018-05-20 NOTE — Progress Notes (Signed)
C/o pelvic pressure. Denies lof, vag bleeding.

## 2018-05-25 ENCOUNTER — Encounter: Payer: Managed Care, Other (non HMO) | Admitting: Obstetrics and Gynecology

## 2018-06-03 ENCOUNTER — Ambulatory Visit (INDEPENDENT_AMBULATORY_CARE_PROVIDER_SITE_OTHER): Payer: Managed Care, Other (non HMO)

## 2018-06-03 ENCOUNTER — Ambulatory Visit (INDEPENDENT_AMBULATORY_CARE_PROVIDER_SITE_OTHER): Payer: Managed Care, Other (non HMO) | Admitting: Obstetrics and Gynecology

## 2018-06-03 VITALS — BP 112/66 | Wt 238.0 lb

## 2018-06-03 DIAGNOSIS — Z3A33 33 weeks gestation of pregnancy: Secondary | ICD-10-CM

## 2018-06-03 DIAGNOSIS — O0993 Supervision of high risk pregnancy, unspecified, third trimester: Secondary | ICD-10-CM

## 2018-06-03 DIAGNOSIS — O30043 Twin pregnancy, dichorionic/diamniotic, third trimester: Secondary | ICD-10-CM | POA: Diagnosis not present

## 2018-06-03 DIAGNOSIS — O30049 Twin pregnancy, dichorionic/diamniotic, unspecified trimester: Secondary | ICD-10-CM

## 2018-06-03 LAB — POCT URINALYSIS DIPSTICK OB: GLUCOSE, UA: NEGATIVE

## 2018-06-03 NOTE — Progress Notes (Signed)
Routine Prenatal Care Visit  Subjective  Angela Braun is a 32 y.o. G2P1001 at 63w5dbeing seen today for ongoing prenatal care.  She is currently monitored for the following issues for this high-risk pregnancy and has Supervision of high-risk pregnancy; Dichorionic diamniotic twin pregnancy, antepartum; and Heartburn during pregnancy, third trimester on their problem list.  ----------------------------------------------------------------------------------- Patient reports no complaints.   Contractions: Not present. Vag. Bleeding: None.  Movement: Present. Denies leaking of fluid.  ----------------------------------------------------------------------------------- The following portions of the patient's history were reviewed and updated as appropriate: allergies, current medications, past family history, past medical history, past social history, past surgical history and problem list. Problem list updated.   Objective  Blood pressure 112/66, weight 238 lb (108 kg), last menstrual period 10/10/2017. Pregravid weight 196 lb (88.9 kg) Total Weight Gain 42 lb (19.1 kg) Urinalysis:      Fetal Status: Fetal Heart Rate (bpm): 157, 138 Fundal Height: 42 cm Movement: Present     General:  Alert, oriented and cooperative. Patient is in no acute distress.  Skin: Skin is warm and dry. No rash noted.   Cardiovascular: Normal heart rate noted  Respiratory: Normal respiratory effort, no problems with respiration noted  Abdomen: Soft, gravid, appropriate for gestational age. Pain/Pressure: Present     Pelvic:  Cervical exam deferred        Extremities: Normal range of motion.  Edema: Trace  ental Status: Normal mood and affect. Normal behavior. Normal judgment and thought content.   [place] Follow [place]  Result Date: 06/03/2018 Patient Name: [name]: [birth year]MRN: [id number] [facility] Date of Service: 06/03/2018 Indications:growth/afi for Di/Di twins  Findings: Di/Di twins intrauterine pregnancy is visualized with : Fetus A: FHR at 154 BPM. Biometrics give an (U/S) Gestational age of 334w3dnd an (U/S) EDD of 07/05/18; this correlates with the clinically established Estimated Date of Delivery: 07/17/18. Fetal presentation is Breech to oblique, Fetal head on the Rt, inferior to the head of fetus B Placenta: anterior. Grade: 1 AFI: (MVP) 6.72 cm Growth percentile is 51.6%. EFW: 5lb3oz /2348g (document both grams and pounds) Fetus B: FHR at 132 BPM. Biometrics give an (U/S) Gestational age of 3530w3dd an (U/S) EDD of 07/05/18; this correlates with the clinically established Estimated Date of Delivery: 07/17/18. Fetal presentation is Breech to oblique . Fetal head is to the maternal Rt , Superior to fetus A head Placenta: could not see the placenta d/t feral position AFI: (MVP) 7.01 cm Growth percentile is 47.6%. EFW: 5lb1oz/ 2295g (document both grams and pounds) Impression: 1. 33w37w5dble Di/Di Intrauterine pregnancy previously established criteria. 2. Growth for fetus A is 51.6 %ile.  AFI (MVD) is 6.72 cm. , Fetus B, Growth for fetus A is 47.6 %ile.  AFI (MVD) is 7.01 cm. Recommendations: 1.Clinical correlation with the patient's History and Physical Exam. Abeer Alsammarraie, RDMS There is a dichorionic diamniotic twin gestation with normal amniotic fluid volume. The fetal biometry correlates with established dating, growth is concordant.  Limited fetal anatomy was performed.The visualized fetal anatomical survey appears within normal limits within the resolution of ultrasound as described above.  It must be noted that a normal ultrasound is unable to rule out fetal aneuploidy.  AndrMalachy Mood, FACOLoura PardonGYN, [facility] 06/03/2018, 10:15 AM   [place]  Result Date: 05/06/2018 ULTRASOUND REPORT Patient Name: Angela Braun: 1/23September 08, 1987: [id number]: [facility] Date of Service: 05/06/2018  Indications:growth/afi Di/Di Twins Findings: Baby A: Dichorionic/ Diamniotic twin intrauterine pregnancy is visualized with FHR at 157 bpm. Biometrics give an (U/S) Gestational age of 22w6dand an (U/S) EDD of 07/09/2018; this correlates with the clinically established Estimated Date of Delivery: 07/17/18. Fetal presentation is Cephalic, Lower/ Left Placenta: Anterior, grade 0. MVP: 5.07 cm Female fetus Growth percentile is 52nd. EFW: 3lbs 3oz/ 1,454 grams [name]: Dichorionic/ Diamniotic twin intrauterine pregnancy is visualized with FHR at 138 bpm. Biometrics give an (U/S) Gestational age of 366w3dnd an (U/S) EDD of 07/12/2018; this correlates with the clinically established Estimated Date of Delivery: 07/17/18. Fetal presentation is transverse, Upper Placenta: Posterior, grade 0. MVP: 6.34 cm Female fetus Growth percentile is 56th. EFW: 3lbs 5oz/ 1,502 grams Impression: 1. 2990w5dable Di/Di Twin Intrauterine pregnancy by previously established criteria. 2. Growth is 52nd %ile for Baby A with MVP of 5.07cm 3. Growth is 56th %ile for [name] with MVP of 6.34cm 4. Discordance 3.3% KriEdwena BundeDMS, RVT The ultrasound images and findings were reviewed by me and I agree with the above report. StePrentice DockerD, FACLoura Pardon/GYN, [facility] 05/06/2018 1:59 PM      Assessment   [age]o. G2P1001 at 33w46w5d 07/17/2018, by Last Menstrual Period presenting for routine prenatal visit  Plan   pregnancy Problems (from 11/11/17 to present)    Problem Noted Resolved   Supervision of high-risk pregnancy 11/11/2017 by GledRod CanM No   Overview Addendum 04/22/2018  9:10 AM by SchuHomero Fellers    Clinic [facility] Prenatal Labs  Dating US BKoreaod type: O/Positive/-- (03/06 [id number] Screen   NIPS: Normal, XX and XY Antibody:Negative (03/06 0952)  Anatomic US cKoreaplete Rubella: <0.90 (03/06 0952) Varicella: Immune  GTT   Third trimester:  RPR: Non Reactive (03/06 0952)     Rhogam  not needed HBsAg: Negative (03/06 0952)   TDaP vaccine  05/20/18            Flu Shot: 04/22/18 HIV: Non Reactive (03/06 0952)   Baby Food   breast                             GBS:   Contraception  given information Pap: 2017  normal  CBB     CS/VBAC NA   Support Person Husband JasoCorene Cornea          Gestational age appropriate obstetric precautions including but not limited to vaginal bleeding, contractions, leaking of fluid and fetal movement were reviewed in detail with the patient.    - Concordant growth, normal AFI - position breech/breech - IOL or LTCS in 38th week ACOG Committee Opinion 560 April 2013 "Medically Indicated Late-Preterm and Early-Term Deliveries"  Return in about 2 weeks (around 06/17/2018) for ROB/AFI/Twins.  AndrMalachy Mood, FACOBagleyGYN, [facility] 06/03/2018, 10:34 AM

## 2018-06-03 NOTE — Progress Notes (Signed)
ROB Pelvic pressure Growth U/S

## 2018-06-16 ENCOUNTER — Inpatient Hospital Stay
Admission: EM | Admit: 2018-06-16 | Discharge: 2018-06-19 | DRG: 787 | Disposition: A | Discharge status: Home / Self Care | Payer: Managed Care, Other (non HMO) | Attending: Certified Nurse Midwife | Admitting: Certified Nurse Midwife

## 2018-06-16 ENCOUNTER — Inpatient Hospital Stay: Payer: Managed Care, Other (non HMO) | Admitting: Anesthesiology

## 2018-06-16 ENCOUNTER — Encounter: Payer: Self-pay | Admitting: Advanced Practice Midwife

## 2018-06-16 ENCOUNTER — Ambulatory Visit (INDEPENDENT_AMBULATORY_CARE_PROVIDER_SITE_OTHER): Payer: Managed Care, Other (non HMO) | Admitting: Advanced Practice Midwife

## 2018-06-16 ENCOUNTER — Inpatient Hospital Stay: Payer: Managed Care, Other (non HMO)

## 2018-06-16 ENCOUNTER — Encounter
Admission: EM | Disposition: A | Discharge status: Home / Self Care | Payer: Self-pay | Source: Home / Self Care | Attending: Certified Nurse Midwife

## 2018-06-16 ENCOUNTER — Other Ambulatory Visit: Payer: Self-pay

## 2018-06-16 VITALS — BP 146/98 | Wt 257.0 lb

## 2018-06-16 DIAGNOSIS — D62 Acute posthemorrhagic anemia: Secondary | ICD-10-CM | POA: Diagnosis not present

## 2018-06-16 DIAGNOSIS — K219 Gastro-esophageal reflux disease without esophagitis: Secondary | ICD-10-CM | POA: Diagnosis present

## 2018-06-16 DIAGNOSIS — O1493 Unspecified pre-eclampsia, third trimester: Secondary | ICD-10-CM

## 2018-06-16 DIAGNOSIS — O9962 Diseases of the digestive system complicating childbirth: Secondary | ICD-10-CM | POA: Diagnosis present

## 2018-06-16 DIAGNOSIS — O0993 Supervision of high risk pregnancy, unspecified, third trimester: Secondary | ICD-10-CM

## 2018-06-16 DIAGNOSIS — Z3A35 35 weeks gestation of pregnancy: Secondary | ICD-10-CM

## 2018-06-16 DIAGNOSIS — Z880 Allergy status to penicillin: Secondary | ICD-10-CM

## 2018-06-16 DIAGNOSIS — R0989 Other specified symptoms and signs involving the circulatory and respiratory systems: Secondary | ICD-10-CM

## 2018-06-16 DIAGNOSIS — R05 Cough: Secondary | ICD-10-CM

## 2018-06-16 DIAGNOSIS — O26893 Other specified pregnancy related conditions, third trimester: Secondary | ICD-10-CM

## 2018-06-16 DIAGNOSIS — O321XX1 Maternal care for breech presentation, fetus 1: Secondary | ICD-10-CM | POA: Diagnosis present

## 2018-06-16 DIAGNOSIS — O1414 Severe pre-eclampsia complicating childbirth: Secondary | ICD-10-CM | POA: Diagnosis present

## 2018-06-16 DIAGNOSIS — O321XX2 Maternal care for breech presentation, fetus 2: Secondary | ICD-10-CM | POA: Diagnosis present

## 2018-06-16 DIAGNOSIS — O163 Unspecified maternal hypertension, third trimester: Secondary | ICD-10-CM | POA: Diagnosis present

## 2018-06-16 DIAGNOSIS — R12 Heartburn: Secondary | ICD-10-CM

## 2018-06-16 DIAGNOSIS — O9081 Anemia of the puerperium: Secondary | ICD-10-CM | POA: Diagnosis not present

## 2018-06-16 DIAGNOSIS — Z91040 Latex allergy status: Secondary | ICD-10-CM | POA: Diagnosis not present

## 2018-06-16 DIAGNOSIS — M7989 Other specified soft tissue disorders: Secondary | ICD-10-CM

## 2018-06-16 DIAGNOSIS — O1413 Severe pre-eclampsia, third trimester: Secondary | ICD-10-CM | POA: Diagnosis present

## 2018-06-16 DIAGNOSIS — O30049 Twin pregnancy, dichorionic/diamniotic, unspecified trimester: Secondary | ICD-10-CM | POA: Diagnosis present

## 2018-06-16 DIAGNOSIS — R059 Cough, unspecified: Secondary | ICD-10-CM

## 2018-06-16 DIAGNOSIS — O30043 Twin pregnancy, dichorionic/diamniotic, third trimester: Secondary | ICD-10-CM | POA: Diagnosis present

## 2018-06-16 DIAGNOSIS — O1494 Unspecified pre-eclampsia, complicating childbirth: Secondary | ICD-10-CM | POA: Diagnosis present

## 2018-06-16 DIAGNOSIS — O0990 Supervision of high risk pregnancy, unspecified, unspecified trimester: Secondary | ICD-10-CM

## 2018-06-16 DIAGNOSIS — O321XX0 Maternal care for breech presentation, not applicable or unspecified: Secondary | ICD-10-CM

## 2018-06-16 LAB — COMPREHENSIVE METABOLIC PANEL
ALT: 17 U/L (ref 0–44)
AST: 25 U/L (ref 15–41)
Albumin: 2.8 g/dL — ABNORMAL LOW (ref 3.5–5.0)
Alkaline Phosphatase: 73 U/L (ref 38–126)
Anion gap: 9 (ref 5–15)
BUN: 10 mg/dL (ref 6–20)
CHLORIDE: 105 mmol/L (ref 98–111)
CO2: 21 mmol/L — AB (ref 22–32)
Calcium: 8.9 mg/dL (ref 8.9–10.3)
Creatinine, Ser: 0.77 mg/dL (ref 0.44–1.00)
Glucose, Bld: 79 mg/dL (ref 70–99)
Potassium: 4.1 mmol/L (ref 3.5–5.1)
SODIUM: 135 mmol/L (ref 135–145)
Total Bilirubin: 0.4 mg/dL (ref 0.3–1.2)
Total Protein: 6.3 g/dL — ABNORMAL LOW (ref 6.5–8.1)

## 2018-06-16 LAB — POCT URINALYSIS DIPSTICK
Bilirubin, UA: NEGATIVE
GLUCOSE UA: NEGATIVE
Ketones, UA: NEGATIVE
LEUKOCYTES UA: NEGATIVE
NITRITE UA: NEGATIVE
PH UA: 5 (ref 5.0–8.0)
PROTEIN UA: POSITIVE — AB
SPEC GRAV UA: 1.025 (ref 1.010–1.025)
UROBILINOGEN UA: NEGATIVE U/dL — AB

## 2018-06-16 LAB — CBC
HEMATOCRIT: 33.6 % — AB (ref 36.0–46.0)
HEMOGLOBIN: 11.7 g/dL — AB (ref 12.0–15.0)
MCH: 32.5 pg (ref 26.0–34.0)
MCHC: 34.8 g/dL (ref 30.0–36.0)
MCV: 93.3 fL (ref 80.0–100.0)
Platelets: 217 10*3/uL (ref 150–400)
RBC: 3.6 MIL/uL — ABNORMAL LOW (ref 3.87–5.11)
RDW: 13.2 % (ref 11.5–15.5)
WBC: 8 10*3/uL (ref 4.0–10.5)
nRBC: 0 % (ref 0.0–0.2)

## 2018-06-16 LAB — PROTEIN / CREATININE RATIO, URINE
Creatinine, Urine: 71 mg/dL
PROTEIN CREATININE RATIO: 4.03 mg/mg{creat} — AB (ref 0.00–0.15)
TOTAL PROTEIN, URINE: 286 mg/dL

## 2018-06-16 LAB — TYPE AND SCREEN
ABO/RH(D): O POS
ANTIBODY SCREEN: NEGATIVE

## 2018-06-16 SURGERY — Surgical Case
Anesthesia: Epidural | Site: Abdomen

## 2018-06-16 MED ORDER — MIDAZOLAM HCL 2 MG/2ML IJ SOLN
INTRAMUSCULAR | Status: AC
  Filled 2018-06-16: qty 2

## 2018-06-16 MED ORDER — DIPHENHYDRAMINE HCL 25 MG PO CAPS: 25.0000 mg | ORAL_CAPSULE | ORAL | Status: DC | PRN

## 2018-06-16 MED ORDER — OXYTOCIN BOLUS FROM INFUSION: 500.0000 mL | Freq: Once | INTRAVENOUS | Status: DC

## 2018-06-16 MED ORDER — ONDANSETRON HCL 4 MG/2ML IJ SOLN: 4.0000 mg | Freq: Once | INTRAMUSCULAR | Status: DC | PRN

## 2018-06-16 MED ORDER — OXYTOCIN 40 UNITS IN LACTATED RINGERS INFUSION - SIMPLE MED
INTRAVENOUS | Status: AC
  Filled 2018-06-16: qty 1000

## 2018-06-16 MED ORDER — CLINDAMYCIN PHOSPHATE 900 MG/50ML IV SOLN
900.0000 mg | INTRAVENOUS | Status: AC
  Administered 2018-06-16: 900 mg via INTRAVENOUS

## 2018-06-16 MED ORDER — BUPIVACAINE IN DEXTROSE 0.75-8.25 % IT SOLN
INTRATHECAL | Status: DC | PRN
  Administered 2018-06-16: 1.8 mL via INTRATHECAL

## 2018-06-16 MED ORDER — SOD CITRATE-CITRIC ACID 500-334 MG/5ML PO SOLN
ORAL | Status: AC
  Administered 2018-06-16: 30 mL
  Filled 2018-06-16: qty 15

## 2018-06-16 MED ORDER — FENTANYL CITRATE (PF) 100 MCG/2ML IJ SOLN
INTRAMUSCULAR | Status: DC | PRN
  Administered 2018-06-16: 30 ug via INTRAVENOUS
  Administered 2018-06-16: 50 ug via INTRAVENOUS
  Administered 2018-06-16: 20 ug via INTRATHECAL

## 2018-06-16 MED ORDER — LABETALOL HCL 5 MG/ML IV SOLN
80.0000 mg | INTRAVENOUS | Status: DC | PRN
  Filled 2018-06-16: qty 16

## 2018-06-16 MED ORDER — MAGNESIUM SULFATE BOLUS VIA INFUSION
4.0000 g | Freq: Once | INTRAVENOUS | Status: AC
  Administered 2018-06-16: 4 g via INTRAVENOUS
  Filled 2018-06-16: qty 500

## 2018-06-16 MED ORDER — LABETALOL HCL 5 MG/ML IV SOLN: 20.0000 mg | INTRAVENOUS | Status: DC | PRN

## 2018-06-16 MED ORDER — GENTAMICIN SULFATE 40 MG/ML IJ SOLN
5.0000 mg/kg | INTRAVENOUS | Status: DC
  Filled 2018-06-16: qty 14.5

## 2018-06-16 MED ORDER — BUPIVACAINE 0.25 % ON-Q PUMP DUAL CATH 400 ML
400.0000 mL | INJECTION | Status: DC
  Filled 2018-06-16: qty 400

## 2018-06-16 MED ORDER — MEASLES, MUMPS & RUBELLA VAC ~~LOC~~ INJ
0.5000 mL | INJECTION | SUBCUTANEOUS | Status: AC | PRN
  Administered 2018-06-19: 0.5 mL via SUBCUTANEOUS
  Filled 2018-06-16: qty 0.5

## 2018-06-16 MED ORDER — HYDRALAZINE HCL 20 MG/ML IJ SOLN: 10.0000 mg | INTRAMUSCULAR | Status: DC | PRN

## 2018-06-16 MED ORDER — OXYTOCIN 40 UNITS IN LACTATED RINGERS INFUSION - SIMPLE MED
2.5000 [IU]/h | INTRAVENOUS | Status: DC
  Administered 2018-06-16: 1 mL via INTRAVENOUS
  Administered 2018-06-16: 299 mL via INTRAVENOUS

## 2018-06-16 MED ORDER — MAGNESIUM SULFATE 40 G IN LACTATED RINGERS - SIMPLE
2.0000 g/h | INTRAVENOUS | Status: DC
  Administered 2018-06-16: 2 g/h via INTRAVENOUS
  Filled 2018-06-16: qty 500

## 2018-06-16 MED ORDER — MORPHINE SULFATE (PF) 0.5 MG/ML IJ SOLN
INTRAMUSCULAR | Status: AC
  Filled 2018-06-16: qty 10

## 2018-06-16 MED ORDER — SODIUM CHLORIDE 0.9% FLUSH: 3.0000 mL | INTRAVENOUS | Status: DC | PRN

## 2018-06-16 MED ORDER — BUPIVACAINE HCL 0.5 % IJ SOLN
INTRAMUSCULAR | Status: DC | PRN
  Administered 2018-06-16: 10 mL

## 2018-06-16 MED ORDER — EPHEDRINE SULFATE 50 MG/ML IJ SOLN
INTRAMUSCULAR | Status: DC | PRN
  Administered 2018-06-16: 10 mg via INTRAVENOUS
  Administered 2018-06-16: 5 mg via INTRAVENOUS

## 2018-06-16 MED ORDER — CLINDAMYCIN PHOSPHATE 900 MG/50ML IV SOLN
INTRAVENOUS | Status: AC
  Filled 2018-06-16: qty 50

## 2018-06-16 MED ORDER — PHENYLEPHRINE HCL 10 MG/ML IJ SOLN
INTRAMUSCULAR | Status: DC | PRN
  Administered 2018-06-16 (×2): 100 ug via INTRAVENOUS

## 2018-06-16 MED ORDER — KETOROLAC TROMETHAMINE 30 MG/ML IJ SOLN: 30.0000 mg | Freq: Four times a day (QID) | INTRAMUSCULAR | Status: AC | PRN

## 2018-06-16 MED ORDER — LACTATED RINGERS IV SOLN: 500.0000 mL | INTRAVENOUS | Status: DC | PRN

## 2018-06-16 MED ORDER — FENTANYL CITRATE (PF) 100 MCG/2ML IJ SOLN
INTRAMUSCULAR | Status: AC
  Filled 2018-06-16: qty 2

## 2018-06-16 MED ORDER — LABETALOL HCL 5 MG/ML IV SOLN
20.0000 mg | INTRAVENOUS | Status: DC | PRN
  Filled 2018-06-16: qty 4

## 2018-06-16 MED ORDER — BUPIVACAINE HCL (PF) 0.5 % IJ SOLN
INTRAMUSCULAR | Status: AC
  Filled 2018-06-16: qty 30

## 2018-06-16 MED ORDER — KETOROLAC TROMETHAMINE 30 MG/ML IJ SOLN
30.0000 mg | Freq: Four times a day (QID) | INTRAMUSCULAR | Status: AC | PRN
  Administered 2018-06-17: 30 mg via INTRAVENOUS
  Filled 2018-06-16: qty 1

## 2018-06-16 MED ORDER — FENTANYL CITRATE (PF) 100 MCG/2ML IJ SOLN: 25.0000 ug | INTRAMUSCULAR | Status: DC | PRN

## 2018-06-16 MED ORDER — LABETALOL HCL 5 MG/ML IV SOLN: 80.0000 mg | INTRAVENOUS | Status: DC | PRN

## 2018-06-16 MED ORDER — BUPIVACAINE HCL (PF) 0.5 % IJ SOLN: 5.0000 mL | Freq: Once | INTRAMUSCULAR | Status: DC

## 2018-06-16 MED ORDER — MEPERIDINE HCL 25 MG/ML IJ SOLN: 6.2500 mg | INTRAMUSCULAR | Status: DC | PRN

## 2018-06-16 MED ORDER — NALOXONE HCL 0.4 MG/ML IJ SOLN: 0.4000 mg | INTRAMUSCULAR | Status: DC | PRN

## 2018-06-16 MED ORDER — IBUPROFEN 600 MG PO TABS
600.0000 mg | ORAL_TABLET | Freq: Four times a day (QID) | ORAL | Status: DC
  Administered 2018-06-17 – 2018-06-19 (×8): 600 mg via ORAL
  Filled 2018-06-16 (×7): qty 1

## 2018-06-16 MED ORDER — NALBUPHINE HCL 10 MG/ML IJ SOLN: 5.0000 mg | INTRAMUSCULAR | Status: DC | PRN

## 2018-06-16 MED ORDER — DIPHENHYDRAMINE HCL 50 MG/ML IJ SOLN: 12.5000 mg | INTRAMUSCULAR | Status: DC | PRN

## 2018-06-16 MED ORDER — LABETALOL HCL 5 MG/ML IV SOLN: 40.0000 mg | INTRAVENOUS | Status: DC | PRN

## 2018-06-16 MED ORDER — LIDOCAINE HCL (PF) 1 % IJ SOLN: 30.0000 mL | INTRAMUSCULAR | Status: DC | PRN

## 2018-06-16 MED ORDER — SOD CITRATE-CITRIC ACID 500-334 MG/5ML PO SOLN: 30.0000 mL | ORAL | Status: DC

## 2018-06-16 MED ORDER — MORPHINE SULFATE (PF) 0.5 MG/ML IJ SOLN
INTRAMUSCULAR | Status: DC | PRN
  Administered 2018-06-16: .1 mg via INTRATHECAL

## 2018-06-16 MED ORDER — LACTATED RINGERS IV SOLN
INTRAVENOUS | Status: DC | PRN
  Administered 2018-06-16: 20:00:00 via INTRAVENOUS

## 2018-06-16 MED ORDER — ONDANSETRON HCL 4 MG/2ML IJ SOLN
4.0000 mg | Freq: Three times a day (TID) | INTRAMUSCULAR | Status: DC | PRN
  Administered 2018-06-17: 4 mg via INTRAVENOUS
  Filled 2018-06-16: qty 2

## 2018-06-16 MED ORDER — LABETALOL HCL 5 MG/ML IV SOLN
40.0000 mg | INTRAVENOUS | Status: DC | PRN
  Filled 2018-06-16: qty 8

## 2018-06-16 MED ORDER — ACETAMINOPHEN 500 MG PO TABS
1000.0000 mg | ORAL_TABLET | Freq: Four times a day (QID) | ORAL | Status: AC
  Administered 2018-06-17: 1000 mg via ORAL
  Filled 2018-06-16: qty 2

## 2018-06-16 MED ORDER — LACTATED RINGERS IV SOLN
INTRAVENOUS | Status: DC
  Administered 2018-06-16 – 2018-06-18 (×5): via INTRAVENOUS

## 2018-06-16 MED ORDER — PROPOFOL 10 MG/ML IV BOLUS
INTRAVENOUS | Status: AC
  Filled 2018-06-16: qty 20

## 2018-06-16 MED ORDER — ONDANSETRON HCL 4 MG/2ML IJ SOLN: 4.0000 mg | Freq: Four times a day (QID) | INTRAMUSCULAR | Status: DC | PRN

## 2018-06-16 MED ORDER — LACTATED RINGERS IV SOLN
INTRAVENOUS | Status: DC
  Administered 2018-06-16: 17:00:00 via INTRAVENOUS

## 2018-06-16 SURGICAL SUPPLY — 29 items
CANISTER SUCT 3000ML PPV (MISCELLANEOUS) ×3 IMPLANT
CATH KIT ON-Q SILVERSOAK 5IN (CATHETERS) ×6 IMPLANT
CLOSURE WOUND 1/2 X4 (GAUZE/BANDAGES/DRESSINGS)
COVER WAND RF STERILE (DRAPES) IMPLANT
DERMABOND ADVANCED (GAUZE/BANDAGES/DRESSINGS) ×2
DERMABOND ADVANCED .7 DNX12 (GAUZE/BANDAGES/DRESSINGS) ×1 IMPLANT
DRSG OPSITE POSTOP 4X10 (GAUZE/BANDAGES/DRESSINGS) IMPLANT
DRSG TELFA 3X8 NADH (GAUZE/BANDAGES/DRESSINGS) IMPLANT
ELECT CAUTERY BLADE 6.4 (BLADE) ×3 IMPLANT
ELECT REM PT RETURN 9FT ADLT (ELECTROSURGICAL) ×3
ELECTRODE REM PT RTRN 9FT ADLT (ELECTROSURGICAL) ×1 IMPLANT
GAUZE SPONGE 4X4 12PLY STRL (GAUZE/BANDAGES/DRESSINGS) IMPLANT
GLOVE BIO SURGEON STRL SZ7 (GLOVE) ×15 IMPLANT
GLOVE INDICATOR 7.5 STRL GRN (GLOVE) ×21 IMPLANT
GOWN STRL REUS W/ TWL LRG LVL3 (GOWN DISPOSABLE) ×4 IMPLANT
GOWN STRL REUS W/TWL LRG LVL3 (GOWN DISPOSABLE) ×8
NS IRRIG 1000ML POUR BTL (IV SOLUTION) ×3 IMPLANT
PACK C SECTION AR (MISCELLANEOUS) ×3 IMPLANT
PAD OB MATERNITY 4.3X12.25 (PERSONAL CARE ITEMS) ×6 IMPLANT
PAD PREP 24X41 OB/GYN DISP (PERSONAL CARE ITEMS) ×3 IMPLANT
STRIP CLOSURE SKIN 1/2X4 (GAUZE/BANDAGES/DRESSINGS) IMPLANT
SUT MNCRL 4-0 (SUTURE) ×2
SUT MNCRL 4-0 27XMFL (SUTURE) ×1
SUT PDS AB 1 TP1 96 (SUTURE) ×3 IMPLANT
SUT PLAIN GUT 0 (SUTURE) ×3 IMPLANT
SUT VIC AB 0 CTX 36 (SUTURE) ×4
SUT VIC AB 0 CTX36XBRD ANBCTRL (SUTURE) ×2 IMPLANT
SUTURE MNCRL 4-0 27XMF (SUTURE) ×1 IMPLANT
SWABSTK COMLB BENZOIN TINCTURE (MISCELLANEOUS) IMPLANT

## 2018-06-16 NOTE — Anesthesia Preprocedure Evaluation (Signed)
Anesthesia Evaluation  Patient identified by MRN, date of birth, ID band Patient awake    Reviewed: Allergy & Precautions, H&P , NPO status , Patient's Chart, lab work & pertinent test results, reviewed documented beta blocker date and time   Airway Mallampati: II  TM Distance: >3 FB Neck ROM: full    Dental no notable dental hx. (+) Teeth Intact   Pulmonary neg pulmonary ROS, Current Smoker,    Pulmonary exam normal breath sounds clear to auscultation       Cardiovascular Exercise Tolerance: Good negative cardio ROS   Rhythm:regular Rate:Normal     Neuro/Psych  Headaches, negative psych ROS   GI/Hepatic negative GI ROS, Neg liver ROS,   Endo/Other  negative endocrine ROSdiabetes  Renal/GU      Musculoskeletal   Abdominal   Peds  Hematology negative hematology ROS (+)   Anesthesia Other Findings   Reproductive/Obstetrics (+) Pregnancy                             Anesthesia Physical Anesthesia Plan  ASA: II  Anesthesia Plan: Epidural   Post-op Pain Management:    Induction:   PONV Risk Score and Plan:   Airway Management Planned:   Additional Equipment:   Intra-op Plan:   Post-operative Plan:   Informed Consent: I have reviewed the patients History and Physical, chart, labs and discussed the procedure including the risks, benefits and alternatives for the proposed anesthesia with the patient or authorized representative who has indicated his/her understanding and acceptance.     Plan Discussed with:   Anesthesia Plan Comments:         Anesthesia Quick Evaluation

## 2018-06-16 NOTE — Progress Notes (Signed)
ROB Headache Swelling/right leg hurting Pelvic pain

## 2018-06-16 NOTE — Progress Notes (Signed)
Routine Prenatal Care Visit  Subjective  Angela Braun is a 32 y.o. G2P1001 at 49w4dbeing seen today for ongoing prenatal care.  She is currently monitored for the following issues for this high-risk pregnancy and has Supervision of high-risk pregnancy; Dichorionic diamniotic twin pregnancy, antepartum; and Heartburn during pregnancy, third trimester on their problem list.  ----------------------------------------------------------------------------------- Patient reports not feeling well. She checked her blood pressure at the drug store and it was elevated. She has had swelling and pain in her legs, headache, chest tightness.  At last visit babies were both breech.  Contractions: Irregular. Vag. Bleeding: None.  Movement: Present. Denies leaking of fluid.  ----------------------------------------------------------------------------------- The following portions of the patient's history were reviewed and updated as appropriate: allergies, current medications, past family history, past medical history, past social history, past surgical history and problem list. Problem list updated.   Objective  Blood pressure (!) 146/98, weight 257 lb (116.6 kg), last menstrual period 10/10/2017. Pregravid weight 196 lb (88.9 kg) Total Weight Gain 61 lb (27.7 kg) Urinalysis: Urine Protein: 4+ on long dip Urine Glucose: negative  Results for BTAIMANE, STIMMEL(MRN [id number] as of 06/16/2018 16:28  Ref. Range 06/16/2018 16:01  Bilirubin, UA Unknown Negative  Clarity, UA Unknown Clear  Color, UA Unknown Gold  Glucose Latest Ref Range: Negative  Negative  Ketones, UA Unknown Negative  Leukocytes, UA Latest Ref Range: Negative  Negative  Nitrite, UA Unknown Negative  pH, UA Latest Ref Range: 5.0 - 8.0  5.0  Protein, UA Latest Ref Range: Negative  Positive (A)  Specific Gravity, UA Latest Ref Range: 1.010 - 1.025  1.025  Urobilinogen, UA Latest Ref Range: 0.2 or 1.0 E.U./dL negative (A)  RBC, UA Unknown  3+    Fetal Status: Fetal Heart Rate (bpm): 133, 145   Movement: Present       General:  Alert, oriented and cooperative. Patient is in no acute distress.  Skin: Skin is warm and dry. No rash noted.   Cardiovascular: Normal heart rate noted  Respiratory: Normal respiratory effort, no problems with respiration noted  Abdomen: Soft, gravid, appropriate for gestational age. Pain/Pressure: Present     Pelvic:  Cervical exam performed      3/70-80/bag of water  Extremities: Normal range of motion.  Edema: Moderate pitting, indentation subsides rapidly  Mental Status: Normal mood and affect. Normal behavior. Normal judgment and thought content.   Assessment   32y.o. G2P1001 at 346w4dy  07/17/2018, by Last Menstrual Period presenting for work-in prenatal visit  Plan   pregnancy Problems (from 11/11/17 to present)    Problem Noted Resolved   Supervision of high-risk pregnancy 11/11/2017 by GlRod CanCNM No   Overview Addendum 06/03/2018 10:33 AM by StMalachy MoodMD    Clinic [facility] Prenatal Labs  Dating [place] week Blood type: O/Positive/-- (03/06 090973  Genetic Screen NIPS: Normal, XX and XY Antibody:Negative (03/06 0952)  Anatomic US[place]omplete Rubella: <0.90 (03/06 0952) Varicella: Immune  GTT   Third trimester:  120 RPR: Non Reactive (03/06 0952)   Rhogam  not needed HBsAg: Negative (03/06 0952)   TDaP vaccine 05/20/18 Flu Shot: 04/22/18 HIV: Non Reactive (03/06 0952)   Baby Food   breast                             GBS:   Contraception  given information Pap: 2017  normal  CBB     CS/VBAC  NA   Support Person Husband Corene Cornea              Preterm labor symptoms and general obstetric precautions including but not limited to vaginal bleeding, contractions, leaking of fluid and fetal movement were reviewed in detail with the patient.  Recommend patient go to [facility] for further evaluation due to elevated BP, swelling, weight gain of 19 pounds in 2 weeks, headache, chest  tightness and generally not feeling well: labs, monitoring, serial BPs, possible delivery  Has appointment scheduled for tomorrow- growth scan and rob  Rod Can, CNM 06/16/2018 4:24 PM

## 2018-06-16 NOTE — Discharge Summary (Signed)
OB Discharge Summary     Patient Name: Angela Braun DOB: May 22, 1986 MRN: [id number]  Date of admission: 06/16/2018 Delivering MD: Prentice Docker, MD  Date of Delivery: 06/16/2018  Date of discharge: 06/19/2018  Admitting diagnosis: preeclampsia with severe features, diamniotic - dichorionic twin gestation, malpresentation of both fetuses Intrauterine pregnancy: [redacted]w[redacted]d    Secondary diagnosis: Preeclampsia     Discharge diagnosis: Preterm Pregnancy Delivered, Preeclampsia (severe), postpartum hemorrhage by quantitative blood loss and dichorionic-diamniotic twin gestation                                                                                                Post partum procedures:magnesium  Augmentation: n/a  Complications: None  Hospital course:  The patient was admitted and was diagnosed with preeclampsia with severe feature by blood pressure and proteinuria criteria.  Given her need for delivery and malpresentation of her twins (both breech), she was taken to the operating room where she underwent a primary low transverse cesarean section that was uncomplicated.  Baby A was a female fetus with weight of 2,510 grams and APGARs of 8 at 1 minute and 9 at 5 minutes.  [name] was a female fetus with weight of 2,760 grams and APGARs of 8 at 1 minute and 8 at 5 minutes. She technically had a postpartum hemorrhage as the quantitative blood loss was 1,508 mL, though the estimated blood loss was about 1,000 mL (an apparently normal amount of blood loss for a c-section). She was maintained magnesium sulfate postpartum for 24 hours.  She was started on labetalol for blood pressure control. Her postpartum course was otherwise normal.   Physical exam  Vitals:   06/18/18 1925 06/18/18 2329 06/19/18 0531 06/19/18 0750  BP: (!) 136/98 (!) 133/97 (!) 145/103 127/86  Pulse: 69 80 82 74  Resp: 20 20  18   Temp: 98.1 F (36.7 C) 98.2 F (36.8 C) 98.3 F (36.8 C) 97.8 F (36.6 C)  TempSrc: Oral  Oral Oral Oral  SpO2:  100% 98% 98%  Weight:      Height:       General: alert, cooperative and no distress Lochia: appropriate Uterine Fundus: firm Incision: Healing well with no significant drainage DVT Evaluation: Calf/Ankle edema is present  Labs: Lab Results  Component Value Date   WBC 8.6 06/18/2018   HGB 10.2 (L) 06/18/2018   HCT 29.7 (L) 06/18/2018   MCV 95.8 06/18/2018   PLT 185 06/18/2018    Discharge instruction: per After Visit Summary.  Medications:  Allergies as of 06/19/2018      Reactions   Penicillins Hives   Latex Rash      Medication List    TAKE these medications   ferrous sulfate 325 (65 FE) MG tablet Take 1 tablet (325 mg total) by mouth 2 (two) times daily with a meal.   ibuprofen 600 MG tablet Commonly known as:  ADVIL,MOTRIN Take 1 tablet (600 mg total) by mouth every 6 (six) hours.   labetalol 200 MG tablet Commonly known as:  NORMODYNE Take 1 tablet (200 mg total) by mouth 3 (three) times  daily.   multivitamin-prenatal 27-0.8 MG Tabs tablet Take 1 tablet by mouth daily at 12 noon.   omeprazole 40 MG capsule Commonly known as:  PRILOSEC Take 1 capsule (40 mg total) by mouth daily.   oxyCODONE-acetaminophen 5-325 MG tablet Commonly known as:  PERCOCET/ROXICET Take 1 tablet by mouth every 4 (four) hours as needed for moderate pain (pain score 4-7/10).            Discharge Care Instructions  (From admission, onward)         Start     Ordered   06/19/18 0000  Discharge wound care:    Comments:  SHOWER DAILY Wash incision gently with soap and water.  Call office with any drainage, redness, or firmness of the incision.   06/19/18 1158          Diet: routine diet  Activity: Advance as tolerated. Pelvic rest for 6 weeks.   Outpatient follow up: Follow-up Information    Will Bonnet, MD. Schedule an appointment as soon as possible for a visit in 1 week(s).   Specialty:  Obstetrics and Gynecology Why:  Post op  incision check Contact information: [address] [place] [address] [phone]             Postpartum contraception: undecided Rhogam Given postpartum: no Rubella vaccine given postpartum: yes Varicella vaccine given postpartum: no TDaP given antepartum or postpartum: given 05/20/2018 Influenza vaccine given: 04/22/2018  Newborn Data:   Angela Braun, Angela Braun [[id number]]  Live born female  Birth Weight:   APGAR: ,   Newborn Delivery   Birth date/time:  06/16/2018 20:50:00 Delivery type:  C-Section, Low Transverse      Angela Braun, Angela Braun [[id number]]  Live born female  Birth Weight:   APGAR: ,   Newborn Delivery   Birth date/time:  06/16/2018 20:53:00 Delivery type:  C-Section, Low Transverse      Baby Feeding: Bottle and Breast  Disposition:home with mother  SIGNED: Adrian Prows MD [place] 06/19/18 12:01 PM

## 2018-06-16 NOTE — Op Note (Addendum)
Cesarean Section Operative Note    Angela Braun   06/16/2018   Pre-operative Diagnosis:  1) intrauterine pregnancy at [redacted]w[redacted]d 2) diamniotic dichorionic twin gestation 3) severe preeclampsia 4) malpresentation of both twins  Post-operative Diagnosis:  1) intrauterine pregnancy at 352w4d2) diamniotic dichorionic twin gestation 3) severe preeclampsia 4) malpresentation of both twins   Procedure: Primary Low Transverse Cesarean Section via Pfannenstiel Incision with Double Layer uterine closure  Surgeon: Surgeon(s) and Role:    * Will BonnetMD - Primary   Assistants: [facility]   Findings:  1) normal appearing gravid uterus, fallopian tubes, and ovaries 2) viable female infant (baby A) with weight of 2,510 grams and APGARS of 8 and 9 3) viable female infant ([name]) with weight of 2,760 grams, APGARs 8 and 8   Quantified Blood Loss: 1,508 mL  Total IV Fluids: 1,000 ml crysalloid  Urine Output: 75 mL clear urine at the end of the procedure  Specimens: placentae  Complications: no complications  Disposition: PACU - hemodynamically stable.   Maternal Condition: stable   Baby condition / location:  Baby boy (A) to nursery, Baby girl (B) to NICU  Procedure Details:  The patient was seen in the Holding Room. The risks, benefits, complications, treatment options, and expected outcomes were discussed with the patient. The patient concurred with the proposed plan, giving informed consent. identified as KeMarshell Garfinkelnd the procedure verified as C-Section Delivery. A Time Out was held and the above information confirmed.   After induction of anesthesia, the patient was draped and prepped in the usual sterile manner. A Pfannenstiel incision was made and carried down through the subcutaneous tissue to the fascia. Fascial incision was made and extended transversely. The fascia was separated from the underlying rectus tissue superiorly and  inferiorly. The peritoneum was identified and entered. Peritoneal incision was extended longitudinally. The bladder flap was not bluntly or sharply freed from the lower uterine segment. A low transverse uterine incision was made and the hysterotomy was extended with cranial-caudal tension.   Delivered from breech presentation was a 2,510 gram Living newborn infant(s) or Female with Apgar scores of 8 at one minute and 9 at five minutes. Cord ph was not sent the umbilical cord was clamped and cut and cord blood was obtained for evaluation.   The gestational sac was ruptured for [name] (girl).  Delivered from breech presentation was a 2,760 gram Living newborn infant(s) female with Apgar scores of 8 at one minute and 8 at five minutes. Cord ph was not sent the umbilical cord was clamped and cut cord blood was obtained for evaluation.   The placentae were removed Intact and appeared normal. The uterine outline, tubes and ovaries appeared normal. The uterine incision was closed with running locked sutures of 0 Vicryl.  A second layer of the same suture was thrown in an imbricating fashion.  Hemostasis was assured.  The uterus was returned to the abdomen and the paracolic gutters were cleared of all clots and debris.  The rectus muscles were inspected and found to be hemostatic.  The On-Q catheter pumps were inserted in accordance with the manufacturer's recommendations.  The catheters were inserted approximately 4cm cephelad to the incision line, approximately 1cm apart, straddling the midline.  They were inserted to a depth of the 4th mark. They were positioned superficial to the rectus abdominus muscles and deep to the rectus fascia.    The fascia was then  reapproximated with running sutures of 1-0 PDS, looped. The subcutaneous tissue was reapproximated using 2-0 plain gut such that no greater than 2cm of dead space remained. The subcuticular closure was performed using 4-0 monocryl. The skin closure was  reinforced using surgical skin glue.   The On-Q catheters were bolused with 5 mL of 0.5% marcaine plain for a total of 10 mL.  The catheters were affixed to the skin with surgical skin glue, steri-strips, and tegaderm.    Instrument, sponge, and needle counts were correct prior the abdominal closure and were correct at the conclusion of the case.  The patient received clindamycin 900 mg IV and gentamicin 580 grams (pharmacy dosed) IV prior to skin incision (within 30 minutes). For VTE prophylaxis she was wearing SCDs throughout the case.    Signed: Will Bonnet, MD 06/16/2018 9:59 PM

## 2018-06-16 NOTE — Progress Notes (Signed)
Patient sent over from clinic for recent symptoms flu-like symptoms, generally feel unwell, and intermittent headache.   She denies current headache, scotomata, and RUQ pain. She has gained 19 pounds in the past 2 weeks.   Her blood pressures have consistently been in the elevated range with three of them in the severe range. On recheck of the blood pressures, they have come down into the mild range and no medication treatment has been indicated.   Her HELLP labs are normal, her PC ratio is 4.03 (normal is < 0.3).  Even though her blood pressures have mostly been in the mild range, her diastolics have been in the 100s consistently. I discussed with the patient that we could conservatively monitor her for worsening or improving blood pressures. However, given that she has had three severe-range blood pressures in her three hours here and her non-severe range blood pressures are borderline-severe, I believe that we would at the most gain a day or so of pregnancy continuation at the risk of significant worsening of the preeclampsia disease.   Therefore, my recommendation is delivery by primary cesarean delivery (malpresenation of both twins verified by me at the bedside just now).   She agrees to the plan of management and to proceed. The risks and benefits of the surgery have been explained to the pain and she has been verbally consented by me to proceed.   I also discussed the need for magnesium sulfate for 24 hours after delivery for seizure prophylaxis.  Prentice Docker, MD, [facility], [facility] 06/16/2018 7:50 PM

## 2018-06-16 NOTE — H&P (Addendum)
OB History & Physical   History of Present Illness:  Chief Complaint:  Sent from office with elevated blood pressures and +4 proteinuria HPI:  Angela Braun is a 32 y.o. G50P1001 female with EDC=07/17/2018 and known di di twins now at 84w4ddated by LMP and confirmed with 8 week ultrasound.  Her pregnancy has been complicated by twin gestation and GERD. She has a history of Crohn's disease, but has been in remission for >5years. Babies have been growing well. Two weeks ago twin A EFW was 5#3oz (2348 gm) and twin B EFW was 5#1oz (2295gm).  Both babies were breech.  [facility]. She states that she was seen in the office today because she has not been feeling well for the past few days. One week ago she began having occipital headaches that were unrelieved with ibuprofen. She does not currently have a headache. Denies scotomata, vaginal bleeding, regular contractions.. Has had a recurrence of nausea in the last 2 weeks and is taking Prilosec for reflux. In the last 3 days she has had chest congestion and a cough. Denies nasal congestion and sore throat. Has had increased swelling of her legs and has gained 19# in 2 weeks.  Last ate an apple at 1300 today. Has had some water since then. [facility]   [facility] Prenatal Labs  Dating UKorea9 week Blood type: O/Positive/-- (03/06 08756   Genetic Screen NIPS: Normal, XX and XY Antibody:Negative (03/06 0952)  Anatomic [place]ore[place]omplete Rubella: <0.90 (03/06 0952) Varicella: Immune  GTT   Third trimester:  120 RPR: Non Reactive (03/06 0952)   Rhogam  not needed HBsAg: Negative (03/06 0952)   TDaP vaccine 05/20/18 Flu Shot: 04/22/18 HIV: Non Reactive (03/06 0952)   Baby Food   breast                             GBS:   Contraception  given information Pap: 2017  normal  CBB     CS/VBAC NA   Support Person Angela Braun        Maternal Medical History:   Past  Medical History:  Diagnosis Date  . Abnormal Pap smear of cervix   . Crohn disease ([place]   . Migraine   . Mitral valve prolapse     Past Surgical History:  Procedure Laterality Date  . COLONOSCOPY      Allergies  Allergen Reactions  . Penicillins Hives  . Latex Rash    Prior to Admission medications   Medication Sig Start Date End Date Taking? Authorizing Provider  omeprazole (PRILOSEC) 40 MG capsule Take 1 capsule (40 mg total) by mouth daily. 05/06/18  Yes SRexene Agent CNM  Prenatal Vit-Fe Fumarate-FA (MULTIVITAMIN-PRENATAL) 27-0.8 MG TABS tablet Take 1 tablet by mouth daily at 12 noon.   Yes [provider]          Social History: She  reports that she has never smoked. She has never used smokeless tobacco. She reports that she does not drink alcohol or use drugs.  Family History: family history includes Ovarian cancer in her maternal aunt.   Review of Systems: Negative x 10 systems reviewed except as noted in the HPI.      Physical Exam:  Vital Signs:  Patient Vitals for the past 24 hrs:  BP Temp Temp src Pulse Resp  Height Weight  06/16/18 1708 (!) 154/111 98.1 F (36.7 C) Oral 85 16 5' 7"  (1.702 m) 116.6 kg  Repeat blood pressure 152/100  General: gravid WF in no acute distress.  HEENT: normocephalic, atraumatic Thyroid: no enlargement or masses Heart: regular rate & rhythm.  No murmurs/rubs/gallops Lungs: clear to auscultation bilaterally Abdomen: soft, gravid, non-tender in RUQ and epigastric area Pelvic:   External: Normal external female genitalia  Cervix: 3/70-80% in office  Extremities: non-tender, symmetric, +1 edema bilaterally.  DTRs: +2  Neurologic: Alert & oriented x 3.   Baseline FHR: Baby A: baseline 150 with accelerations to 170, moderate variability [name]: baseline 130 with accelerations to 150, minimal to moderate variability  Bedside Ultrasound:  Number of Fetus: two  Presentation: breech/ breech    Assessment:   Angela Braun is a 32 y.o. G47P1001 female at 35w4dwith preeclampsia  Di di twins, both in breech presentation Plan:  1. Admit to Labor & Delivery - notified Dr JGlennon Mac& anesthesia.   2. CBC, T&S, CMP, PC ratio, RPR 3. NPO, IV LR 4. Consents obtained. 5. CXR 6. Monitor blood pressures, treat if they persist in severe range   CDalia Heading 06/16/2018 5:03 PM

## 2018-06-16 NOTE — Anesthesia Procedure Notes (Signed)
Spinal  Patient location during procedure: OB Start time: 06/16/2018 8:23 PM Staffing Performed: anesthesiologist  Spinal Block Patient position: sitting Prep: Betadine Patient monitoring: cardiac monitor, heart rate, continuous pulse ox and blood pressure Approach: midline Location: L4-5 Injection technique: single-shot Needle Needle type: Whitacre  Needle gauge: 24 G Needle length: 10 cm Needle insertion depth: 8 cm Assessment Sensory level: T6 Additional Notes SAB placed without difficulty.  +CSF No Heme.  No paresthesia.  VSST and tolerated well.  JA

## 2018-06-16 NOTE — Transfer of Care (Signed)
Immediate Anesthesia Transfer of Care Note  Patient: Angela Braun  Procedure(s) Performed: CESAREAN SECTION (N/A Abdomen)  Patient Location: PACU  Anesthesia Type:General  Level of Consciousness: awake, alert , oriented and patient cooperative  Airway & Oxygen Therapy: Patient Spontanous Breathing  Post-op Assessment: Report given to RN and Post -op Vital signs reviewed and stable  Post vital signs: Reviewed and stable  Last Vitals:  Vitals Value Taken Time  BP    Temp    Pulse    Resp    SpO2      Last Pain:  Vitals:   06/16/18 1708  TempSrc: Oral         Complications: No apparent anesthesia complications

## 2018-06-16 NOTE — Anesthesia Post-op Follow-up Note (Signed)
Anesthesia QCDR form completed.        

## 2018-06-17 ENCOUNTER — Encounter: Payer: Managed Care, Other (non HMO) | Admitting: Obstetrics and Gynecology

## 2018-06-17 ENCOUNTER — Other Ambulatory Visit: Payer: Managed Care, Other (non HMO)

## 2018-06-17 LAB — RPR: RPR Ser Ql: NONREACTIVE

## 2018-06-17 LAB — CBC
HCT: 31 % — ABNORMAL LOW (ref 36.0–46.0)
HEMOGLOBIN: 10.6 g/dL — AB (ref 12.0–15.0)
MCH: 32.5 pg (ref 26.0–34.0)
MCHC: 34.2 g/dL (ref 30.0–36.0)
MCV: 95.1 fL (ref 80.0–100.0)
Platelets: 192 10*3/uL (ref 150–400)
RBC: 3.26 MIL/uL — ABNORMAL LOW (ref 3.87–5.11)
RDW: 13.1 % (ref 11.5–15.5)
WBC: 9.5 10*3/uL (ref 4.0–10.5)
nRBC: 0 % (ref 0.0–0.2)

## 2018-06-17 MED ORDER — COCONUT OIL OIL
1.0000 "application " | TOPICAL_OIL | Status: DC | PRN
  Filled 2018-06-17: qty 120

## 2018-06-17 MED ORDER — PANTOPRAZOLE SODIUM 40 MG PO TBEC
40.0000 mg | DELAYED_RELEASE_TABLET | Freq: Every day | ORAL | Status: DC
  Administered 2018-06-18 – 2018-06-19 (×2): 40 mg via ORAL
  Filled 2018-06-17 (×2): qty 1

## 2018-06-17 MED ORDER — OXYTOCIN 40 UNITS IN LACTATED RINGERS INFUSION - SIMPLE MED: 2.5000 [IU]/h | INTRAVENOUS | Status: AC

## 2018-06-17 MED ORDER — PRENATAL MULTIVITAMIN CH
1.0000 | ORAL_TABLET | Freq: Every day | ORAL | Status: DC
  Administered 2018-06-17 – 2018-06-19 (×3): 1 via ORAL
  Filled 2018-06-17 (×3): qty 1

## 2018-06-17 MED ORDER — SENNOSIDES-DOCUSATE SODIUM 8.6-50 MG PO TABS
2.0000 | ORAL_TABLET | ORAL | Status: DC
  Administered 2018-06-17 – 2018-06-19 (×3): 2 via ORAL
  Filled 2018-06-17 (×3): qty 2

## 2018-06-17 MED ORDER — IBUPROFEN 600 MG PO TABS
ORAL_TABLET | ORAL | Status: AC
  Filled 2018-06-17: qty 1

## 2018-06-17 MED ORDER — FERROUS SULFATE 325 (65 FE) MG PO TABS
325.0000 mg | ORAL_TABLET | Freq: Two times a day (BID) | ORAL | Status: DC
  Administered 2018-06-18 – 2018-06-19 (×3): 325 mg via ORAL
  Filled 2018-06-17 (×3): qty 1

## 2018-06-17 MED ORDER — OXYCODONE-ACETAMINOPHEN 5-325 MG PO TABS: 2.0000 | ORAL_TABLET | ORAL | Status: DC | PRN

## 2018-06-17 MED ORDER — MENTHOL 3 MG MT LOZG
1.0000 | LOZENGE | OROMUCOSAL | Status: DC | PRN
  Filled 2018-06-17: qty 9

## 2018-06-17 MED ORDER — ACETAMINOPHEN 325 MG PO TABS
650.0000 mg | ORAL_TABLET | ORAL | Status: DC | PRN
  Administered 2018-06-18 – 2018-06-19 (×4): 650 mg via ORAL
  Filled 2018-06-17 (×4): qty 2

## 2018-06-17 MED ORDER — DIBUCAINE 1 % RE OINT: 1.0000 "application " | TOPICAL_OINTMENT | RECTAL | Status: DC | PRN

## 2018-06-17 MED ORDER — SIMETHICONE 80 MG PO CHEW
80.0000 mg | CHEWABLE_TABLET | Freq: Three times a day (TID) | ORAL | Status: DC
  Administered 2018-06-17 – 2018-06-19 (×7): 80 mg via ORAL
  Filled 2018-06-17 (×7): qty 1

## 2018-06-17 MED ORDER — OXYCODONE-ACETAMINOPHEN 5-325 MG PO TABS
1.0000 | ORAL_TABLET | ORAL | Status: DC | PRN
  Administered 2018-06-18 (×4): 1 via ORAL
  Filled 2018-06-17 (×4): qty 1

## 2018-06-17 MED ORDER — WITCH HAZEL-GLYCERIN EX PADS: 1.0000 "application " | MEDICATED_PAD | CUTANEOUS | Status: DC | PRN

## 2018-06-17 NOTE — Progress Notes (Signed)
Admit Date: 06/16/2018 Today's Date: 06/17/2018  Subjective: Postpartum Day 1: Cesarean Delivery last night for twins/breech/preeclampsia Patient reports min incisional pain.  Breast feeding going well.  Edema improved. No headache, blurry vision, epigastric pain.    Objective: Vital signs in last 24 hours: Temp:  [98.1 F (36.7 C)-98.3 F (36.8 C)] 98.2 F (36.8 C) (10/10 0729) Pulse Rate:  [71-114] 81 (10/10 0729) Resp:  [12-41] 16 (10/10 0729) BP: (110-155)/(72-111) 124/87 (10/10 0729) SpO2:  [91 %-100 %] 95 % (10/10 0729) Weight:  [116.6 kg] 116.6 kg (10/09 1708)  Intake/Output Summary (Last 24 hours) at 06/17/2018 0817 Last data filed at 06/17/2018 0730 Gross per 24 hour  Intake 2959.78 ml  Output 4078 ml  Net -1118.22 ml   Physical Exam:  General: alert, cooperative and no distress Lochia: appropriate Uterine Fundus: firm Incision: healing well, no significant drainage, no dehiscence, no significant erythema DVT Evaluation: No evidence of DVT seen on physical exam. Negative Homan's sign. Extr, min edema, 2+ DTRs Chest CTA B Heart reg rate/rhythm  Recent Labs    06/16/18 1702 06/17/18 0533  HGB 11.7* 10.6*  HCT 33.6* 31.0*    Assessment/Plan: 1. Twins, breech presentation, delivered  2. Anemia  3. Preeclampsia, third trimester    Status post Cesarean section. Doing well postoperatively.  Continue current care. Continue MAGNESIUM for now, consider discontinuation this pm (24 hours is at 2100) Monitor s/sx blood pressures and preeclampsia.  Improved tremendously now. Breast feeding, provide support. Especially with twins, one in SCN at this time. Plans Nexplanon. Monitor s/sx anemia, Fe, repeat CBC am  Angela Braun 06/17/2018, 8:15 AM

## 2018-06-17 NOTE — Lactation Note (Signed)
This note was copied from a baby's chart. Lactation Consultation Note  Patient Name: Angela Braun Today's Date: 06/17/2018 Reason for consult: Initial assessment   Maternal Data Has patient been taught Hand Expression?: Yes Does the patient have breastfeeding experience prior to this delivery?: Yes  Feeding    LATCH Score Latch: Too sleepy or reluctant, no latch achieved, no sucking elicited.  Audible Swallowing: None  Type of Nipple: Everted at rest and after stimulation  Comfort (Breast/Nipple): Soft / non-tender  Hold (Positioning): Full assist, staff holds infant at breast  LATCH Score: 4  Interventions Interventions: DEBP;Skin to skin  Lactation Tools Discussed/Used Pump Review: Setup, frequency, and cleaning Initiated by:: Angela Lennox, RN, IBCLC Date initiated:: 06/17/18   Consult Status Consult Status: Follow-up Date: 06/17/18 Follow-up type: In-patient  LC to Mother's room to talk about plans for breastfeeding. Mother states that she attempted to breastfeed her older daughter but it did not work out and she was having difficulty latching and quit after 3 weeks. Mother states that she has tried to latch [name] (Angela Braun) using the nipple shield and did not have success at the last feeding. Mother expressed interest in exclusively pumping this time. [facility] taught mother how to hand express and expressed several drops of colostrum that was spoon fed to Angela Braun. LC provided a pump kit and initiated pumping with mother.  Angela Braun 65/68/1275, 11:13 AM

## 2018-06-17 NOTE — Progress Notes (Signed)
Admit Date: 06/16/2018 Today's Date: 06/17/2018  Subjective: Doing well this afternoon Mild nausea No headache, edema, CP, SOB  Objective: Vital signs in last 24 hours: Temp:  [97.6 F (36.4 C)-98.4 F (36.9 C)] 98.4 F (36.9 C) (10/10 1429) Pulse Rate:  [71-114] 84 (10/10 1631) Resp:  [12-41] 18 (10/10 1631) BP: (110-155)/(72-111) 143/93 (10/10 1631) SpO2:  [91 %-100 %] 98 % (10/10 1629)  Physical Exam:  General: alert, cooperative, appears stated age and no distress Lochia: appropriate Uterine Fundus: firm Incision: healing well DVT Evaluation: No evidence of DVT seen on physical exam. Negative Homan's sign.  Recent Labs    06/16/18 1702 06/17/18 0533  HGB 11.7* 10.6*  HCT 33.6* 31.0*  Good UOP  Assessment/Plan: Status post Cesarean section. Doing well postoperatively.  Continue current care Stop magnesium.  Transfer to floor Monitor BPs (coming up slowly, still normal) Consider meds if needed for HTN  Hoyt Koch 06/17/2018, 5:28 PM

## 2018-06-18 LAB — CBC
HCT: 29.7 % — ABNORMAL LOW (ref 36.0–46.0)
HEMOGLOBIN: 10.2 g/dL — AB (ref 12.0–15.0)
MCH: 32.9 pg (ref 26.0–34.0)
MCHC: 34.3 g/dL (ref 30.0–36.0)
MCV: 95.8 fL (ref 80.0–100.0)
Platelets: 185 10*3/uL (ref 150–400)
RBC: 3.1 MIL/uL — AB (ref 3.87–5.11)
RDW: 13.4 % (ref 11.5–15.5)
WBC: 8.6 10*3/uL (ref 4.0–10.5)
nRBC: 0 % (ref 0.0–0.2)

## 2018-06-18 MED ORDER — LABETALOL HCL 200 MG PO TABS
200.0000 mg | ORAL_TABLET | Freq: Two times a day (BID) | ORAL | Status: DC
  Administered 2018-06-18 – 2018-06-19 (×3): 200 mg via ORAL
  Filled 2018-06-18 (×3): qty 1

## 2018-06-18 NOTE — Lactation Note (Signed)
This note was copied from a baby's chart. Lactation Consultation Note  Patient Name: Angela Braun Today's Date: 06/18/2018   Mom is pumping breasts to express breast milk and plans to bottle feed twins expressed breast milk, requests two larger flanges to use for pumping, I gave her those.   Maternal Data Formula Feeding for Exclusion: Yes Reason for exclusion: Mother's choice to formula and breast feed on admission Has patient been taught Hand Expression?: Yes Does the patient have breastfeeding experience prior to this delivery?: Yes  Feeding Feeding Type: Bottle Fed - Formula Nipple Type: Slow - flow  LATCH Score                   Interventions    Lactation Tools Discussed/Used  Given Medela breastmilk storage magnet   Consult Status      Angela Braun 06/18/2018, 12:23 PM

## 2018-06-18 NOTE — Anesthesia Postprocedure Evaluation (Signed)
Anesthesia Post Note  Patient: Angela Braun  Procedure(s) Performed: CESAREAN SECTION (N/A Abdomen)  Patient location during evaluation: Mother Baby Anesthesia Type: Spinal Level of consciousness: oriented and awake and alert Pain management: pain level controlled Vital Signs Assessment: post-procedure vital signs reviewed and stable Respiratory status: spontaneous breathing Cardiovascular status: blood pressure returned to baseline and stable Postop Assessment: no headache, no backache, no apparent nausea or vomiting, patient able to bend at knees and able to ambulate Anesthetic complications: no     Last Vitals:  Vitals:   06/18/18 0443 06/18/18 0908  BP: (!) 144/106 (!) 143/101  Pulse: 87 80  Resp: 18 20  Temp: 36.6 C 36.8 C  SpO2: 99% 100%    Last Pain:  Vitals:   06/18/18 0927  TempSrc:   PainSc: 4                  Cleda Mccreedy Angela Braun

## 2018-06-18 NOTE — Plan of Care (Signed)
Vs stable; BP is elevated BUT is not in the treatable range; foley removed this shift and pt has voided several times; pt up in room independently now; using breast pump; tolerating regular diet; taking motrin and tylenol for pain control

## 2018-06-18 NOTE — Progress Notes (Signed)
Nutrition Brief Note  Patient identified on the Malnutrition Screening Tool (MST) Report  Wt Readings from Last 15 Encounters:  06/16/18 116.6 kg  06/16/18 116.6 kg  06/03/18 108 kg  05/20/18 108.4 kg  05/06/18 103.6 kg  04/22/18 102.1 kg  04/02/18 98.4 kg  03/04/18 94.8 kg  02/05/18 91.6 kg  01/07/18 91.2 kg  12/10/17 89.8 kg  11/11/17 92.5 kg  05/26/14 104.3 kg   Pt s/p Procedure: Primary Low Transverse Cesarean Section via Pfannenstiel Incision with Double Layer uterine closure  Pt delivered twins yesterday. Per stork report, pt with 19# wt gain int he past 2 weeks. Pt has has gained approximately 59# pounds throughout her pregnancy, which is above the recommended weight gain for pregravid obese females with multiples.   Body mass index is 40.25 kg/m. Patient meets criteria for extreme obesity, class III based on current BMI. Pregravid wt: 196#, BMI: 31 (obesity, class I).   Current diet order is regular, patient is consuming approximately 75-100% of meals at this time. Labs and medications reviewed.   No nutrition interventions warranted at this time. If nutrition issues arise, please consult RD.   Callen Vancuren A. Mayford Knife, RD, LDN, CDE Pager: [phone] After hours Pager: [phone]

## 2018-06-18 NOTE — Progress Notes (Signed)
POD#2 LTCS, pre-eclampsia, twins, PPH Subjective:  Sitting up on the side of the bed. Pain control is good with medication. Passing flatus. Voiding with no difficulty. Tolerating a regular diet. Ambulating well. Nausea only after taking Percocet. No chest pain or shortness of breath. Encouraged incentive spirometry.  Objective:  Blood pressure (!) 143/101, pulse 80, temperature 98.2 F (36.8 C), temperature source Oral, resp. rate 20, height 5' 7"  (1.702 m), weight 116.6 kg, last menstrual period 10/10/2017, SpO2 100 %, currently breastfeeding.  General: NAD Cardiovascular: RRR, no murmurs, rubs or gallops Pulmonary: no increased work of breathing, CTAB Abdomen: non-distended, non-tender, fundus firm, lochia appropriate Incision: C/D/I with On-Q infusing Extremities: trace edema, no erythema, no tenderness  Results for orders placed or performed during the hospital encounter of 06/16/18 (from the past 72 hour(s))  CBC     Status: Abnormal   Collection Time: 06/16/18  5:02 PM  Result Value Ref Range   WBC 8.0 4.0 - 10.5 K/uL   RBC 3.60 (L) 3.87 - 5.11 MIL/uL   Hemoglobin 11.7 (L) 12.0 - 15.0 g/dL   HCT 33.6 (L) 36.0 - 46.0 %   MCV 93.3 80.0 - 100.0 fL   MCH 32.5 26.0 - 34.0 pg   MCHC 34.8 30.0 - 36.0 g/dL   RDW 13.2 11.5 - 15.5 %   Platelets 217 150 - 400 K/uL   nRBC 0.0 0.0 - 0.2 %    Comment: Performed at [facility], [address] [place], [place] [address]  RPR     Status: None   Collection Time: 06/16/18  5:02 PM  Result Value Ref Range   RPR Ser Ql Non Reactive Non Reactive    Comment: (NOTE) Performed At: [facility] [address] [place], [place] [id number] Rush Farmer MD YK:[id number]   Protein / creatinine ratio, urine     Status: Abnormal   Collection Time: 06/16/18  5:02 PM  Result Value Ref Range   Creatinine, Urine 71 mg/dL   Total Protein, Urine 286 mg/dL    Comment: RESULT CONFIRMED BY MANUAL DILUTION.  TFK NO NORMAL RANGE  ESTABLISHED FOR THIS TEST    Protein Creatinine Ratio 4.03 (H) 0.00 - 0.15 mg/mg[Cre]    Comment: Performed at [facility], [address] [place], [place] [address]  Comprehensive metabolic panel     Status: Abnormal   Collection Time: 06/16/18  5:02 PM  Result Value Ref Range   Sodium 135 135 - 145 mmol/L   Potassium 4.1 3.5 - 5.1 mmol/L   Chloride 105 98 - 111 mmol/L   CO2 21 (L) 22 - 32 mmol/L   Glucose, Bld 79 70 - 99 mg/dL   BUN 10 6 - 20 mg/dL   Creatinine, Ser 0.77 0.44 - 1.00 mg/dL   Calcium 8.9 8.9 - 10.3 mg/dL   Total Protein 6.3 (L) 6.5 - 8.1 g/dL   Albumin 2.8 (L) 3.5 - 5.0 g/dL   AST 25 15 - 41 U/L   ALT 17 0 - 44 U/L   Alkaline Phosphatase 73 38 - 126 U/L   Total Bilirubin 0.4 0.3 - 1.2 mg/dL   GFR calc non Af Amer >60 >60 mL/min   GFR calc Af Amer >60 >60 mL/min    Comment: (NOTE) The eGFR has been calculated using the CKD EPI equation. This calculation has not been validated in all clinical situations. eGFR's persistently <60 mL/min signify possible Chronic Kidney Disease.    Anion gap 9 5 - 15  Comment: Performed at [facility], [address] [place], [place] [address]  Type and screen [place]     Status: None   Collection Time: 06/16/18  5:03 PM  Result Value Ref Range   ABO/RH(D) O POS    Antibody Screen NEG    Sample Expiration      06/19/2018 Performed at [facility], [address] [place], [place] [address]   CBC     Status: Abnormal   Collection Time: 06/17/18  5:33 AM  Result Value Ref Range   WBC 9.5 4.0 - 10.5 K/uL   RBC 3.26 (L) 3.87 - 5.11 MIL/uL   Hemoglobin 10.6 (L) 12.0 - 15.0 g/dL   HCT 31.0 (L) 36.0 - 46.0 %   MCV 95.1 80.0 - 100.0 fL   MCH 32.5 26.0 - 34.0 pg   MCHC 34.2 30.0 - 36.0 g/dL   RDW 13.1 11.5 - 15.5 %   Platelets 192 150 - 400 K/uL   nRBC 0.0 0.0 - 0.2 %    Comment: Performed at [facility], [address] [place], [place] [address]  CBC      Status: Abnormal   Collection Time: 06/18/18  4:59 AM  Result Value Ref Range   WBC 8.6 4.0 - 10.5 K/uL   RBC 3.10 (L) 3.87 - 5.11 MIL/uL   Hemoglobin 10.2 (L) 12.0 - 15.0 g/dL   HCT 29.7 (L) 36.0 - 46.0 %   MCV 95.8 80.0 - 100.0 fL   MCH 32.9 26.0 - 34.0 pg   MCHC 34.3 30.0 - 36.0 g/dL   RDW 13.4 11.5 - 15.5 %   Platelets 185 150 - 400 K/uL   nRBC 0.0 0.0 - 0.2 %    Comment: Performed at [facility], [address], [place], [place] [address]     Assessment:   32 y.o. [phone] post-operative day #2 recovering well.  Plan:  1) Acute blood loss anemia - hemodynamically stable and asymptomatic - PO ferrous sulfate and vitamins  2) Blood Type --/--/O POS (10/09 1703) / Rubella <0.90 (03/06 3343) / Varicella immune MMR vaccine ordered  3) TDAP status: given AP 05/20/2018.  4) Breastfeeding; plans Nexplanon  5) Disposition: continue post-operative care.  Avel Sensor, CNM 06/18/2018  9:50 AM

## 2018-06-18 NOTE — Anesthesia Post-op Follow-up Note (Signed)
  Anesthesia Pain Follow-up Note  Patient: Angela Braun  Day #: 2  Date of Follow-up: 06/18/2018 Time: 10:30 AM  Last Vitals:  Vitals:   06/18/18 0443 06/18/18 0908  BP: (!) 144/106 (!) 143/101  Pulse: 87 80  Resp: 18 20  Temp: 36.6 C 36.8 C  SpO2: 99% 100%    Level of Consciousness: alert  Pain: mild   Side Effects:None  Catheter Site Exam:clean, dry     Plan: D/C from anesthesia care at surgeon's request  [place]

## 2018-06-18 NOTE — Progress Notes (Signed)
Discussed pro and cons of stating antihypertensive with family and patient.  Given that she was diagnosed with preecclampsia with severe features, completed 24-hr postpartum course of magnesium sulfate. In this setting I'm not worried about masking severe range BP as the magnesium sulfate course has been completed.  Will start patient on 232m labetalol bid.

## 2018-06-19 ENCOUNTER — Ambulatory Visit: Payer: Self-pay

## 2018-06-19 ENCOUNTER — Inpatient Hospital Stay: Payer: Managed Care, Other (non HMO)

## 2018-06-19 MED ORDER — FERROUS SULFATE 325 (65 FE) MG PO TABS: 325.0000 mg | ORAL_TABLET | Freq: Two times a day (BID) | ORAL | 3 refills | Status: DC

## 2018-06-19 MED ORDER — OXYCODONE-ACETAMINOPHEN 5-325 MG PO TABS: 1.0000 | ORAL_TABLET | ORAL | 0 refills | Status: DC | PRN

## 2018-06-19 MED ORDER — IBUPROFEN 600 MG PO TABS: 600.0000 mg | ORAL_TABLET | Freq: Four times a day (QID) | ORAL | 0 refills | Status: DC

## 2018-06-19 MED ORDER — LABETALOL HCL 200 MG PO TABS: 200.0000 mg | ORAL_TABLET | Freq: Three times a day (TID) | ORAL | 0 refills | Status: DC

## 2018-06-19 NOTE — Plan of Care (Signed)
Vs stable except BP; BP has been 130s-140s/90s-106; pt has labetalol PO BID; up ad lib; tolerating regular diet; taking motrin and occasionally a tylenol or percocet; pumping occasionally; babies getting 22 cal enfamil

## 2018-06-19 NOTE — Progress Notes (Signed)
Reviewed D/C instructions with pt and family. Pt verbalized understanding of teaching. Discharged to home via W/C. Pt to schedule f/u appt.  

## 2018-06-19 NOTE — Progress Notes (Signed)
Patient ID: Angela Braun, female   DOB: 1986/06/09, 32 y.o.   MRN: [id number] Admit Date: 06/16/2018 Today's Date: 06/19/2018  Subjective: Postpartum Day 3: Cesarean Delivery Patient reports tolerating PO, + flatus, + BM and no problems voiding.    Objective: Vital signs in last 24 hours: Temp:  [98 F (36.7 C)-98.3 F (36.8 C)] 98.3 F (36.8 C) (10/12 0531) Pulse Rate:  [69-93] 82 (10/12 0531) Resp:  [18-20] 20 (10/11 2329) BP: (133-151)/(97-109) 145/103 (10/12 0531) SpO2:  [98 %-100 %] 98 % (10/12 0531)  Physical Exam:  General: alert and cooperative Lochia: appropriate Uterine Fundus: firm Incision: healing well, no significant drainage, no dehiscence, no significant erythema DVT Evaluation: Calf/Ankle edema is present. Right leg is more swollen than the left.  Recent Labs    06/17/18 0533 06/18/18 0459  HGB 10.6* 10.2*  HCT 31.0* 29.7*    Assessment/Plan:  Assessment/Plan: 32 yo s/p Cesarean section. POD#3 1. Preeclampsia- s/p magnesium. On labetalol 200 BID. Will increase to 200 mg TID.  2. Right LE leg edema- will obtain LE [place] to rule out DVT. Recommended TED hose.  3. Breast and Bottle feeding 4. Undecided about contraception- discussed options.  5. Rubella non-immune, vaccination ordered 6. Possible discharge home later today if [place] negative for DVT.  Christanna R Schuman 06/19/2018, 9:48 AM

## 2018-06-19 NOTE — Lactation Note (Signed)
This note was copied from a baby's chart. Lactation Consultation Note  Patient Name: Angela Braun Today's Date: 06/19/2018  Mom plans to continue to pump and bottle feed formula until breast milk is in greater supply.  Twin B was gagging and spitting from the larger volumes of formula.  Spitting has decreased since volumes of formula were decreased.  Mom has a Spectra pump at home from [employer].  Reviewed pumping, collection, storage, labeling and handling of breast milk.  Information given on how to increase milk supply if needed.  Mom's nipples are sore and scabbed.  Since she has increased flange size, discomfort has gotten better.  Lactation community resources discussed.   Maternal Data    Feeding    LATCH Score                   Interventions    Lactation Tools Discussed/Used     Consult Status      Angela Braun 06/19/2018, 5:47 PM

## 2018-06-19 NOTE — Progress Notes (Signed)
Paged MD regarding BP. Discharge orders for labetalol TID, in-house orders are for BID. Per Dr. Jerene Pitch, ok to give next dose now, pt will take TID after discharge. Orders placed for TED hose, abd binder for leg swelling and discomfort at incision site. Pt plans to get implant at postpartum follow up appt.

## 2018-06-22 LAB — SURGICAL PATHOLOGY

## 2018-06-23 ENCOUNTER — Telehealth: Payer: Self-pay

## 2018-06-23 ENCOUNTER — Ambulatory Visit (INDEPENDENT_AMBULATORY_CARE_PROVIDER_SITE_OTHER): Payer: Managed Care, Other (non HMO) | Admitting: Obstetrics and Gynecology

## 2018-06-23 ENCOUNTER — Encounter: Payer: Self-pay | Admitting: Obstetrics and Gynecology

## 2018-06-23 VITALS — BP 130/94 | HR 72 | Wt 219.0 lb

## 2018-06-23 DIAGNOSIS — Z4889 Encounter for other specified surgical aftercare: Secondary | ICD-10-CM

## 2018-06-23 DIAGNOSIS — Z9889 Other specified postprocedural states: Secondary | ICD-10-CM

## 2018-06-23 NOTE — Telephone Encounter (Signed)
FMLA/DISABILITY form for [employer] filled out, signature obtained, and given to [place] for processing.

## 2018-06-23 NOTE — Progress Notes (Signed)
Postoperative Follow-up Patient presents post op from 1lTCS 1weeks ago for twins (breech presentation A), preeclampsia with severe features.  Subjective: Patient reports marked improvement in her preop symptoms. Eating a regular diet without difficulty. Pain is controlled without any medications.  Activity: normal activities of daily living.  Objective: Blood pressure (!) 130/94, pulse 72, weight 219 lb (99.3 kg), last menstrual period 10/10/2017, not currently breastfeeding.  General: NAD Pulmonary: no increased work of breathing Abdomen: soft, non-tender, non-distended, incision D/C/I Extremities: no edema Neurologic: normal gait    Admission on 06/16/2018, Discharged on 06/19/2018  Component Date Value Ref Range Status  . WBC 06/16/2018 8.0  4.0 - 10.5 K/uL Final  . RBC 06/16/2018 3.60* 3.87 - 5.11 MIL/uL Final  . Hemoglobin 06/16/2018 11.7* 12.0 - 15.0 g/dL Final  . HCT 06/16/2018 33.6* 36.0 - 46.0 % Final  . MCV 06/16/2018 93.3  80.0 - 100.0 fL Final  . MCH 06/16/2018 32.5  26.0 - 34.0 pg Final  . MCHC 06/16/2018 34.8  30.0 - 36.0 g/dL Final  . RDW 06/16/2018 13.2  11.5 - 15.5 % Final  . Platelets 06/16/2018 217  150 - 400 K/uL Final  . nRBC 06/16/2018 0.0  0.0 - 0.2 % Final   Performed at [facility], [address], [place], [place] [address]  . ABO/RH(D) 06/16/2018 O POS   Final  . Antibody Screen 06/16/2018 NEG   Final  . Sample Expiration 06/16/2018    Final                   Value:06/19/2018 Performed at [facility], [address], [place], [place] [address]   . RPR Ser Ql 06/16/2018 Non Reactive  Non Reactive Final   Comment: (NOTE) Performed At: [facility] [address] [place], [place] [id number] Rush Farmer MD ZR:[id number]   . Creatinine, Urine 06/16/2018 71  mg/dL Final  . Total Protein, Urine 06/16/2018 286  mg/dL Final   Comment: RESULT CONFIRMED BY MANUAL DILUTION.  TFK NO NORMAL RANGE ESTABLISHED FOR THIS  TEST   . Protein Creatinine Ratio 06/16/2018 4.03* 0.00 - 0.15 mg/mg[Cre] Final   Performed at [facility], [address] [place], [place] [address]  . Sodium 06/16/2018 135  135 - 145 mmol/L Final  . Potassium 06/16/2018 4.1  3.5 - 5.1 mmol/L Final  . Chloride 06/16/2018 105  98 - 111 mmol/L Final  . CO2 06/16/2018 21* 22 - 32 mmol/L Final  . Glucose, Bld 06/16/2018 79  70 - 99 mg/dL Final  . BUN 06/16/2018 10  6 - 20 mg/dL Final  . Creatinine, Ser 06/16/2018 0.77  0.44 - 1.00 mg/dL Final  . Calcium 06/16/2018 8.9  8.9 - 10.3 mg/dL Final  . Total Protein 06/16/2018 6.3* 6.5 - 8.1 g/dL Final  . Albumin 06/16/2018 2.8* 3.5 - 5.0 g/dL Final  . AST 06/16/2018 25  15 - 41 U/L Final  . ALT 06/16/2018 17  0 - 44 U/L Final  . Alkaline Phosphatase 06/16/2018 73  38 - 126 U/L Final  . Total Bilirubin 06/16/2018 0.4  0.3 - 1.2 mg/dL Final  . GFR calc non Af Amer 06/16/2018 >60  >60 mL/min Final  . GFR calc Af Amer 06/16/2018 >60  >60 mL/min Final   Comment: (NOTE) The eGFR has been calculated using the CKD EPI equation. This calculation has not been validated in all clinical situations. eGFR's persistently <60 mL/min signify possible Chronic Kidney Disease.   Marland Kitchen  Anion gap 06/16/2018 9  5 - 15 Final   Performed at [facility], [address] [place], [place] [address]  . Gonorrhea 11/11/2017 Negative   Final  . Chlamydia 11/11/2017 Negative   Final  . Rubella 11/11/2017 Nonimmune   Final  . Varicella 11/11/2017 Immune   Final  . SURGICAL PATHOLOGY 06/16/2018    Final                   Value:Surgical Pathology CASE: [id number] PATIENT: Lear Ng Surgical Pathology Report     SPECIMEN SUBMITTED: A. Third trimester twin placentas  CLINICAL HISTORY: Di/Di twins 35w 4d  PRE-OPERATIVE DIAGNOSIS: Breech twins, preeclampsia  POST-OPERATIVE DIAGNOSIS: Same as pre-op     DIAGNOSIS: A. THIRD TRIMESTER TWIN PLACENTAS; CESAREAN SECTION: - DICHORIONIC  DIAMNIOTIC TWIN PLACENTAS, NOT FUSED. TWIN A: - PLACENTAL WEIGHT 443 G, APPROPRIATE FOR GESTATIONAL AGE. - NO CHORIOAMNIONITIS, VILLITIS, OR INFARCTION. - THREE-VESSEL UMBILICAL CORD. TWIN B: - PLACENTAL WEIGHT 464 G, APPROPRIATE FOR GESTATIONAL AGE. - NO CHORIOAMNIONITIS, VILLITIS, OR INFARCTION. - MINOR SUBCHORIONIC HEMATOMA. - THREE-VESSEL UMBILICAL CORD.   GROSS DESCRIPTION: A. Labeled: Placenta twins 1 clamp baby A, 2 clamp [name] Received: In formalin Weight: A-443 g; B-464 grams Shape: two separate roughly ovoid discs Dimensions: 16.7 x 14.9 x 2.8 cm; B-16.4 x 15.7 x 2.4 cm Accessor                         y lobes: 0 Dividing membranes: Predominantly separated, thin pink-tan Other findings: None noted Designation of placentas: Twin A -1 clamp, twin B-2 clamps Twin A:      Umbilical cord:           Size -28 cm in length x 0.9-1.1 cm in diameter           Number of vessels -3           Insertion -eccentric           Distance of insertion from margin -2.9, cm which has possible dividing membrane attachment 2 centimeter up the umbilical cord      Fetal surface:           Size: 16.7 x 14.9 x 2.7 cm           Description: Blue gray with focal membrane insertion circummarginate on approximately 40% which is possibly dividing membranes      Membranes: Pink-tan translucent      Maternal surface: Pink-tan with purple-tan with slightly irregular contour      Other findings: Sectioning reveals congested parenchyma beneath the fetal surface Twin B:       Umbilical cord:           Size -20.1 cm in length x 1.0-1.1 cm in diameter           Number of vessels -3           Inserti                         on -eccentric           Distance of insertion from margin -3.2 cm      Fetal surface:           Size: 16.4 x 15.7 x 2.4 cm           Description: Granular blue gray      Membranes: Pink-tan translucent Maternal surface: Purple-tan with loosely adherent  blood clot on no  more than 5% and a peripheral area (1.2 x 1.0 x 0.6 cm) suggestive of laminated blood clot      Other findings: Sectioning the parenchyma beneath the fetal surface there is a laminated 0.9 x 0.9 x 0.6 cm area in the peripheral one third  Block summary: 1 - possible dividing membranes 2 - twin A umbilical cord 3 - twin A membranes 4-5 - twin A parenchyma 6 - twin B umbilical cord 7 - twin B membranes 8 - peripheral area with possible old blood clot twin B 9-10 - twin B parenchyma 11 - representative laminated area beneath fetal surface twin B   Final Diagnosis performed by Bryan Lemma, MD.   Electronically signed 06/22/2018 1:07:52PM The electronic signature indicates that the named Attending Path                         ologist has evaluated the specimen  Technical component performed at [facility], [address] [place], [place] [address] Lab: [phone] Dir: Rush Farmer, MD, MMM  Professional component performed at [facility], [facility], [place], [place], [place] [address] Lab: [phone] Dir: Dellia Nims. Rubinas, MD   . WBC 06/17/2018 9.5  4.0 - 10.5 K/uL Final  . RBC 06/17/2018 3.26* 3.87 - 5.11 MIL/uL Final  . Hemoglobin 06/17/2018 10.6* 12.0 - 15.0 g/dL Final  . HCT 06/17/2018 31.0* 36.0 - 46.0 % Final  . MCV 06/17/2018 95.1  80.0 - 100.0 fL Final  . MCH 06/17/2018 32.5  26.0 - 34.0 pg Final  . MCHC 06/17/2018 34.2  30.0 - 36.0 g/dL Final  . RDW 06/17/2018 13.1  11.5 - 15.5 % Final  . Platelets 06/17/2018 192  150 - 400 K/uL Final  . nRBC 06/17/2018 0.0  0.0 - 0.2 % Final   Performed at [facility], [address] [place], [place] [address]  . WBC 06/18/2018 8.6  4.0 - 10.5 K/uL Final  . RBC 06/18/2018 3.10* 3.87 - 5.11 MIL/uL Final  . Hemoglobin 06/18/2018 10.2* 12.0 - 15.0 g/dL Final  . HCT 06/18/2018 29.7* 36.0 - 46.0 % Final  . MCV 06/18/2018 95.8  80.0 - 100.0 fL Final  . MCH 06/18/2018 32.9  26.0 - 34.0 pg Final  . MCHC  06/18/2018 34.3  30.0 - 36.0 g/dL Final  . RDW 06/18/2018 13.4  11.5 - 15.5 % Final  . Platelets 06/18/2018 185  150 - 400 K/uL Final  . nRBC 06/18/2018 0.0  0.0 - 0.2 % Final   Performed at [facility], [address] [place], [place] [address]    Assessment: 32 y.o. s/p 1LTCS stable  Plan: Patient has done well after surgery with no apparent complications.  I have discussed the post-operative course to date, and the expected progress moving forward.  The patient understands what complications to be concerned about.  I will see the patient in routine follow up, or sooner if needed.    Activity plan: No heavy lifting.  BP improved but still slightly elevated.  Continue labetalol  Return in about 1 week (around 06/30/2018) for postop incisin check and BP check - [place].   Malachy Mood, MD, [facility], [facility] 06/23/2018, 3:31 PM

## 2018-06-25 NOTE — Progress Notes (Signed)
FYI

## 2018-06-30 ENCOUNTER — Ambulatory Visit (INDEPENDENT_AMBULATORY_CARE_PROVIDER_SITE_OTHER): Payer: Managed Care, Other (non HMO) | Admitting: Obstetrics and Gynecology

## 2018-06-30 ENCOUNTER — Encounter: Payer: Self-pay | Admitting: Obstetrics and Gynecology

## 2018-06-30 VITALS — BP 128/88 | Wt 204.0 lb

## 2018-06-30 DIAGNOSIS — Z09 Encounter for follow-up examination after completed treatment for conditions other than malignant neoplasm: Secondary | ICD-10-CM

## 2018-06-30 NOTE — Progress Notes (Signed)
   Postoperative Follow-up Patient presents post op from cesarean section  2 weeks ago.  Subjective: She denies fever, chills, nausea and vomiting. Eating a regular diet without difficulty. The patient is not having any pain.  Activity: normal activities of daily living. She does not issues with her incision.   Denies HA, visual changes and RUQ pain. Is taking labetalol 200 mg po tid.   Objective: BP 128/88   Wt 204 lb (92.5 kg)   BMI 31.95 kg/m   Constitutional: Well nourished, well developed female in no acute distress.  HEENT: normal Skin: Warm and dry.  Abdomen: S/NT/ND/+BS clean, dry, intact and without erythema, induration, warmth, and tenderness Extremity: no edema   Assessment: 32 y.o. s/p cesarean section progressing well  Plan: Patient has done well after surgery with no apparent complications.  I have discussed the post-operative course to date, and the expected progress moving forward.  The patient understands what complications to be concerned about.    Activity plan: increase slowly Continue labetalol as current prescribed.   Return in about 2 weeks (around 07/14/2018) for BP check with Dr. Glennon Mac.  Prentice Docker, MD 06/30/2018 2:37 PM

## 2018-07-14 ENCOUNTER — Telehealth: Payer: Self-pay | Admitting: Obstetrics and Gynecology

## 2018-07-14 ENCOUNTER — Ambulatory Visit (INDEPENDENT_AMBULATORY_CARE_PROVIDER_SITE_OTHER): Payer: Managed Care, Other (non HMO) | Admitting: Obstetrics and Gynecology

## 2018-07-14 ENCOUNTER — Encounter: Payer: Self-pay | Admitting: Obstetrics and Gynecology

## 2018-07-14 VITALS — BP 118/74 | Ht 67.0 in | Wt 202.0 lb

## 2018-07-14 DIAGNOSIS — Z09 Encounter for follow-up examination after completed treatment for conditions other than malignant neoplasm: Secondary | ICD-10-CM

## 2018-07-14 NOTE — Telephone Encounter (Signed)
Pt coming in on 07/27/18 @ 2:50 for liletta insertion with SDJ

## 2018-07-14 NOTE — Progress Notes (Signed)
   Postoperative Follow-up Patient presents post op from cesarean section  4 weeks ago.  Subjective: She denies fever, chills, nausea and vomiting. Eating a regular diet without difficulty. The patient is not having any pain.  Activity: normal activities of daily living. She does not issues with her incision.   Denies HA, visual changes and RUQ pain. Is taking labetalol 200 mg po bid.   Objective: BP 118/74   Ht 5' 7"  (1.702 m)   Wt 202 lb (91.6 kg)   BMI 31.64 kg/m   Constitutional: Well nourished, well developed female in no acute distress.  HEENT: normal Skin: Warm and dry.  Abdomen: S/NT/ND/+BS clean, dry, intact and without erythema, induration, warmth, and tenderness Extremity: no edema   Assessment: 32 y.o. s/p cesarean section progressing well  Plan: Patient has done well after surgery with no apparent complications.  I have discussed the post-operative course to date, and the expected progress moving forward.  The patient understands what complications to be concerned about.    Activity plan: increase slowly Decrease labetalol to 162m tid until next visit  Return in about 2 weeks (around 07/28/2018) for Six Week Postpartum and Liletta insertion.  SPrentice Docker MD 07/14/2018 2:20 PM

## 2018-07-16 NOTE — Telephone Encounter (Signed)
Noted. Will order to arrive by apt date/time. 

## 2018-07-27 ENCOUNTER — Encounter: Payer: Self-pay | Admitting: Obstetrics and Gynecology

## 2018-07-27 ENCOUNTER — Ambulatory Visit (INDEPENDENT_AMBULATORY_CARE_PROVIDER_SITE_OTHER): Payer: Managed Care, Other (non HMO) | Admitting: Obstetrics and Gynecology

## 2018-07-27 DIAGNOSIS — Z30430 Encounter for insertion of intrauterine contraceptive device: Secondary | ICD-10-CM | POA: Diagnosis not present

## 2018-07-27 MED ORDER — LEVONORGESTREL 19.5 MCG/DAY IU IUD: 1.0000 | INTRAUTERINE_SYSTEM | Freq: Once | INTRAUTERINE | 0 refills | Status: DC

## 2018-07-27 NOTE — Progress Notes (Signed)
Postpartum Visit   Chief Complaint  Patient presents with  . 6 week post partum    History of Present Illness: Patient is a 32 y.o. [id number] presents for postpartum visit.  Date of delivery: 06/16/2018 Type of delivery: C-Section Episiotomy No.  Laceration: no Pregnancy or labor problems:  Preeclampsia with severe features, di-di twins, malpresentation of both fetusus Any problems since the delivery:  No. She has weaned off the blood pressure medication and is doing quite well without any symptoms.  Newborn Details:  SINGLETON :  1. 87 name: [name] and Minette Brine (twins) Birth weight: 5.4 and 5.6 Maternal Details:  Breast Feeding:  no Post partum depression/anxiety noted:  Some anxiety  Edinburgh Post-Partum Depression Score:  12. Declines treatment at this time.  Denies SI/HI. Date of last PAP: 10/24/15  Normal, HPV negative  Past Medical History:  Diagnosis Date  . Abnormal Pap smear of cervix   . Crohn disease ([place])   . Migraine   . Mitral valve prolapse     Past Surgical History:  Procedure Laterality Date  . CESAREAN SECTION N/A 06/16/2018   Procedure: CESAREAN SECTION;  Surgeon: Will Bonnet, MD;  Location: [facility] ORS;  Service: Obstetrics;  Laterality: N/A;  . COLONOSCOPY      Prior to Admission medications   Medication Sig Start Date End Date Taking? Authorizing Provider  ferrous sulfate 325 (65 FE) MG tablet Take 1 tablet (325 mg total) by mouth 2 (two) times daily with a meal. 06/19/18   Schuman, Christanna R, MD  Prenatal Vit-Fe Fumarate-FA (MULTIVITAMIN-PRENATAL) 27-0.8 MG TABS tablet Take 1 tablet by mouth daily at 12 noon.    [provider]    Allergies  Allergen Reactions  . Penicillins Hives  . Latex Rash     Social History   Socioeconomic History  . Marital status: Married    Spouse name: Not on file  . Number of children: Not on file  . Years of education: Not on file  . Highest education level: Not on file  Occupational  History  . Not on file  Social Needs  . Financial resource strain: Not hard at all  . Food insecurity:    Worry: Never true    Inability: Never true  . Transportation needs:    Medical: No    Non-medical: No  Tobacco Use  . Smoking status: Never Smoker  . Smokeless tobacco: Never Used  Substance and Sexual Activity  . Alcohol use: No    Frequency: Never  . Drug use: No  . Sexual activity: Yes    Birth control/protection: Implant  Lifestyle  . Physical activity:    Days per week: 0 days    Minutes per session: Not on file  . Stress: Not at all  Relationships  . Social connections:    Talks on phone: More than three times a week    Gets together: Twice a week    Attends religious service: 1 to 4 times per year    Active member of club or organization: No    Attends meetings of clubs or organizations: Never    Relationship status: Married  . Intimate partner violence:    Fear of current or ex partner: No    Emotionally abused: No    Physically abused: No    Forced sexual activity: No  Other Topics Concern  . Not on file  Social History Narrative  . Not on file    Family History  Problem Relation Age  of Onset  . Ovarian cancer Maternal Aunt     Review of Systems  Constitutional: Negative.   HENT: Negative.   Eyes: Negative.   Respiratory: Negative.   Cardiovascular: Negative.   Gastrointestinal: Negative.   Genitourinary: Negative.   Musculoskeletal: Negative.   Skin: Negative.   Neurological: Negative.   Psychiatric/Behavioral: Negative for depression, hallucinations, memory loss, substance abuse and suicidal ideas. The patient is nervous/anxious and has insomnia.      Physical Exam BP 114/74   Ht 5' 7"  (1.702 m)   Wt 204 lb (92.5 kg)   BMI 31.95 kg/m   Physical Exam  Constitutional: She is oriented to person, place, and time. She appears well-developed and well-nourished. No distress.  Genitourinary: Vagina normal and uterus normal. Pelvic exam was  performed with patient supine. There is no rash, tenderness or lesion on the right labia. There is no rash, tenderness or lesion on the left labia. Right adnexum does not display mass, does not display tenderness and does not display fullness. Left adnexum does not display mass, does not display tenderness and does not display fullness. Cervix does not exhibit motion tenderness, lesion or polyp.   Uterus is mobile and midaxial. Uterus is not enlarged, tender, exhibiting a mass or irregular (is regular).  HENT:  Head: Normocephalic and atraumatic.  Eyes: Conjunctivae are normal. No scleral icterus.  Neck: Normal range of motion. Neck supple. No thyromegaly present.  Cardiovascular: Normal rate and regular rhythm. Exam reveals no gallop and no friction rub.  No murmur heard. Pulmonary/Chest: Effort normal and breath sounds normal. She has no wheezes. She has no rales.  Abdominal: Soft. Bowel sounds are normal. She exhibits no distension and no mass. There is no tenderness. There is no rebound and no guarding.  Incision: without erythema, induration, warmth, and tenderness. It is clean, dry, and intact.    Musculoskeletal: Normal range of motion. She exhibits no edema.  Neurological: She is alert and oriented to person, place, and time. No cranial nerve deficit.  Skin: Skin is warm and dry. No erythema.  Psychiatric: She has a normal mood and affect. Her behavior is normal. Judgment normal.    IUD Insertion Procedure Note [place]) Patient identified, informed consent performed, consent signed.   Discussed risks of irregular bleeding, cramping, infection, malpositioning, expulsion or uterine perforation of the IUD (1:1000 placements)  which may require further procedure such as laparoscopy.  IUD while effective at preventing pregnancy do not prevent transmission of sexually transmitted diseases and use of barrier methods for this purpose was discussed. Time out was performed.  Urine pregnancy test  negative.  Speculum placed in the vagina.  Cervix visualized.  Cleaned with Betadine x 2.  Grasped anteriorly with a single tooth tenaculum.  Uterus sounded to 9 cm. IUD placed per manufacturer's recommendations.  Strings trimmed to 3 cm. Tenaculum was removed, good hemostasis noted.  Patient tolerated procedure well.   Patient was given post-procedure instructions.  She was advised to have backup contraception for one week.  Patient was also asked to check IUD strings periodically and follow up in 6 weeks for IUD check.  Female Chaperone present during breast and/or pelvic exam.  Assessment: 32 y.o. G2P1103 presenting for 6 week postpartum visit  Plan: Problem List Items Addressed This Visit    None    Visit Diagnoses    Postpartum care and examination    -  Primary   Encounter for IUD insertion       Relevant Medications  Levonorgestrel (LILETTA, 52 MG,) 19.5 MCG/DAY IUD IUD     1) Contraception Education given regarding options for contraception, including IUD placement. Liletta placed today without difficulty.  2)  Pap - ASCCP guidelines and rational discussed.  Patient opts for routine screening interval. She is up to date.  3) Patient underwent screening for postpartum depression with some concerns noted.  She will continue to monitor her symptoms, which sound situational.  Will recheck her in four weeks when she returns for an IUD string check. She understands to call, if her situation worsens in the mean time.   Return in about 4 weeks (around 08/24/2018) for IUD string check.   Prentice Docker, MD 07/27/2018 5:51 PM

## 2018-08-26 ENCOUNTER — Ambulatory Visit: Payer: Managed Care, Other (non HMO) | Admitting: Obstetrics and Gynecology

## 2018-09-06 ENCOUNTER — Ambulatory Visit (INDEPENDENT_AMBULATORY_CARE_PROVIDER_SITE_OTHER): Payer: Managed Care, Other (non HMO) | Admitting: Obstetrics and Gynecology

## 2018-09-06 ENCOUNTER — Encounter: Payer: Self-pay | Admitting: Obstetrics and Gynecology

## 2018-09-06 VITALS — BP 110/68 | Ht 67.0 in | Wt 205.0 lb

## 2018-09-06 DIAGNOSIS — Z30431 Encounter for routine checking of intrauterine contraceptive device: Secondary | ICD-10-CM | POA: Diagnosis not present

## 2018-09-06 NOTE — Progress Notes (Signed)
IUD String Check  Subjctive: [name] presents for IUD string check.  She had a Liletta placed 5 weeks ago.  Since placement of her IUD she had some vaginal bleeding that stopped several days ago.  She denies cramping or discomfort. She did have a small amount of RLQ cramping for a couple days, which is not present now.  She has had intercourse since placement.  She has not checked the strings.  She denies any fever, chills, nausea, vomiting, or other complaints.    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 She states that she has used Prozac in the past with good results. She would like to see how things go for now and she will send me a message, if she thinks that she needs medication. Denies SI/HI today.  Objective: BP 110/68 (BP Location: Left Arm, Patient Position: Sitting, Cuff Size: Normal)   Ht 5' 7"  (1.702 m)   Wt 205 lb (93 kg)   LMP 07/27/2018 (Approximate)   Breastfeeding No   BMI 32.11 kg/m  Physical Exam Exam conducted with a chaperone present.  Constitutional:      General: She is not in acute distress.    Appearance: She is Braun-developed.  HENT:     Head: Normocephalic and atraumatic.     Nose: Nose normal.  Eyes:     Conjunctiva/sclera: Conjunctivae normal.  Neck:     Musculoskeletal: Normal range of motion.  Cardiovascular:     Rate and Rhythm: Normal rate and regular rhythm.     Heart sounds: Normal heart sounds.  Pulmonary:     Effort: Pulmonary effort is normal.     Breath sounds: Normal breath sounds.  Abdominal:     General: There is no distension.     Palpations: Abdomen is soft.     Tenderness: There is no abdominal tenderness. There is no guarding or rebound.     Hernia: There is no hernia in the right inguinal area or left inguinal area.  Genitourinary:    Exam position: Lithotomy position.     Labia:        Right: No rash, tenderness or lesion.        Left: No rash, tenderness  or lesion.      Urethra: No prolapse or urethral lesion.     Vagina: Normal. No vaginal discharge.     Cervix: Normal.     Uterus: Normal. Not deviated, not enlarged, not fixed and not tender.      Adnexa: Right adnexa normal and left adnexa normal.       Right: No mass or fullness.         Left: No mass, tenderness or fullness.       Comments: IUD strings visualized and trimmed to 3 cm Musculoskeletal: Normal range of motion.  Skin:    General: Skin is warm and dry.     Findings: No rash.  Neurological:     General: No focal deficit present.     Mental Status: She is alert and oriented to person, place, and time.  Psychiatric:        Mood and Affect: Mood normal.        Behavior: Behavior normal.        Judgment: Judgment normal.     Female chaperone was present for the entirety of the pelvic exam  Assessment: 32 y.o. year old female status post prior [place] IUD  placement 5 week ago, doing Braun.  Plan: 1.  The patient was given instructions to check her IUD strings monthly and call with any problems or concerns.  She should call for fevers, chills, abnormal vaginal discharge, pelvic pain, or other complaints. 2.  She will return for a annual exam in 1 year.  All questions answered.  15 minutes spent in face to face discussion with > 50% spent in counseling, management, and coordination of care for her newly-placed IUD.  Risks and benefits of IUD discussed including the risks of irregular bleeding, cramping, infection, malpositioning, expulsion, which may require further procedures such as laparoscopy.  IUDs while effective at preventing pregnancy do not prevent transmission of sexually transmitted diseases and use of barrier methods for this purpose was discussed.  Low overall incidence of failure with 99.7% efficacy rate in typical use.    Prentice Docker, MD 09/06/2018 2:33 PM

## 2018-10-01 ENCOUNTER — Other Ambulatory Visit: Payer: Self-pay | Admitting: Obstetrics and Gynecology

## 2018-10-01 DIAGNOSIS — F411 Generalized anxiety disorder: Secondary | ICD-10-CM | POA: Insufficient documentation

## 2018-10-01 MED ORDER — FLUOXETINE HCL 10 MG PO TABS: 10.0000 mg | ORAL_TABLET | Freq: Every day | ORAL | 1 refills | Status: DC

## 2018-11-25 ENCOUNTER — Other Ambulatory Visit: Payer: Self-pay | Admitting: Obstetrics and Gynecology

## 2018-11-25 DIAGNOSIS — F411 Generalized anxiety disorder: Secondary | ICD-10-CM

## 2018-11-25 NOTE — Telephone Encounter (Signed)
advise

## 2018-12-24 ENCOUNTER — Other Ambulatory Visit: Payer: Self-pay | Admitting: Obstetrics and Gynecology

## 2018-12-24 DIAGNOSIS — F411 Generalized anxiety disorder: Secondary | ICD-10-CM

## 2019-03-04 IMAGING — DX DG CHEST 1V PORT
1 series · 1 of 1 positions shown · non-contrast
Comparison: 10/15/2010 chest radiograph.

CLINICAL DATA: Cough, preeclampsia, third trimester pregnancy

EXAM:
PORTABLE CHEST 1 VIEW

[chest ap]
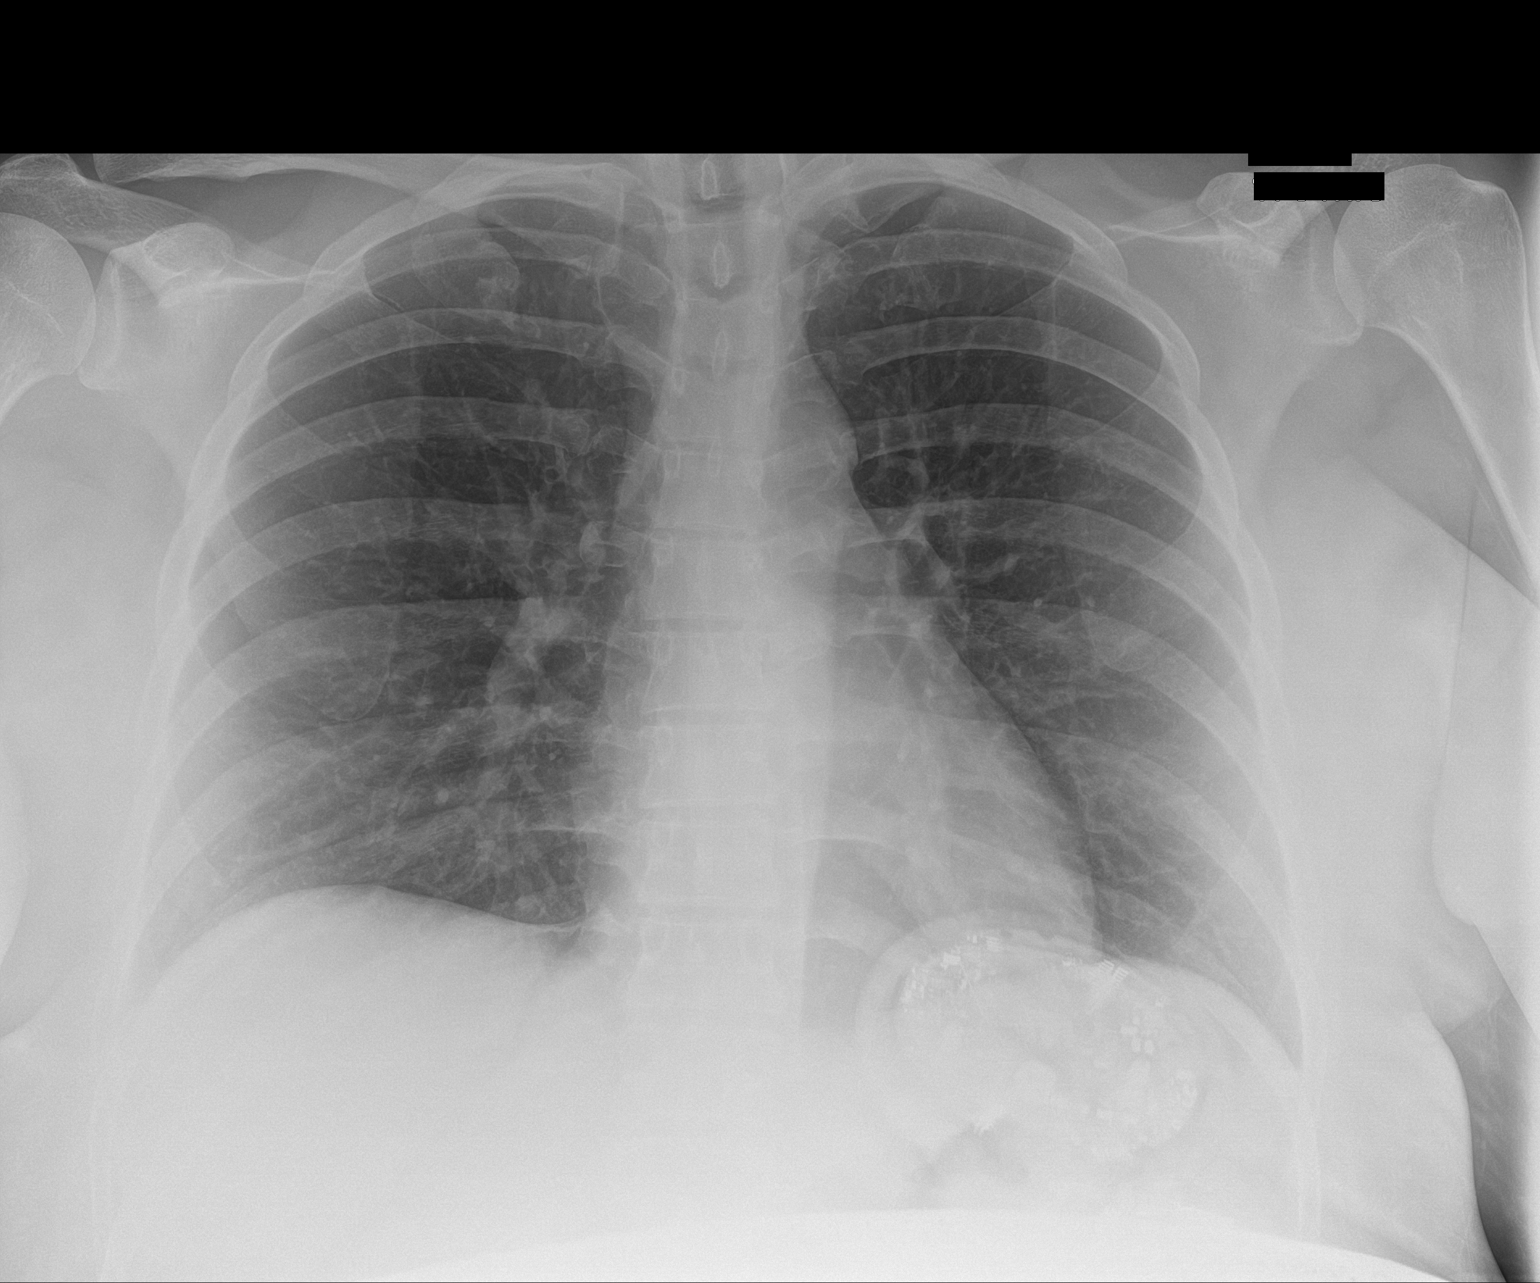

[1 of 1 positions shown; findings below may reference images not displayed]

FINDINGS: Metallic device overlies the left hemidiaphragm. Stable
cardiomediastinal silhouette with normal heart size. No
pneumothorax. No pleural effusion. Lungs appear clear, with no acute
consolidative airspace disease and no pulmonary edema.
IMPRESSION: No active cardiopulmonary disease.

Metallic device overlies the left hemidiaphragm, presumably external
to the patient.

## 2019-03-24 ENCOUNTER — Other Ambulatory Visit: Payer: Self-pay | Admitting: Obstetrics and Gynecology

## 2019-03-24 DIAGNOSIS — F411 Generalized anxiety disorder: Secondary | ICD-10-CM

## 2019-04-11 ENCOUNTER — Other Ambulatory Visit: Payer: Self-pay

## 2019-04-11 DIAGNOSIS — F411 Generalized anxiety disorder: Secondary | ICD-10-CM

## 2019-04-11 NOTE — Telephone Encounter (Signed)
Please advise 

## 2019-04-13 MED ORDER — FLUOXETINE HCL 10 MG PO TABS: 10.0000 mg | ORAL_TABLET | Freq: Every day | ORAL | 0 refills | Status: DC

## 2019-05-22 ENCOUNTER — Other Ambulatory Visit: Payer: Self-pay

## 2019-05-22 DIAGNOSIS — F411 Generalized anxiety disorder: Secondary | ICD-10-CM | POA: Diagnosis present

## 2019-05-22 DIAGNOSIS — Z79899 Other long term (current) drug therapy: Secondary | ICD-10-CM

## 2019-05-22 DIAGNOSIS — I10 Essential (primary) hypertension: Secondary | ICD-10-CM | POA: Diagnosis present

## 2019-05-22 DIAGNOSIS — I341 Nonrheumatic mitral (valve) prolapse: Secondary | ICD-10-CM | POA: Diagnosis present

## 2019-05-22 DIAGNOSIS — Z20828 Contact with and (suspected) exposure to other viral communicable diseases: Secondary | ICD-10-CM | POA: Diagnosis present

## 2019-05-22 DIAGNOSIS — Z880 Allergy status to penicillin: Secondary | ICD-10-CM

## 2019-05-22 DIAGNOSIS — Z975 Presence of (intrauterine) contraceptive device: Secondary | ICD-10-CM

## 2019-05-22 DIAGNOSIS — K50012 Crohn's disease of small intestine with intestinal obstruction: Principal | ICD-10-CM | POA: Diagnosis present

## 2019-05-22 DIAGNOSIS — Z91040 Latex allergy status: Secondary | ICD-10-CM

## 2019-05-22 DIAGNOSIS — K8020 Calculus of gallbladder without cholecystitis without obstruction: Secondary | ICD-10-CM | POA: Diagnosis present

## 2019-05-22 DIAGNOSIS — Z23 Encounter for immunization: Secondary | ICD-10-CM

## 2019-05-22 DIAGNOSIS — F329 Major depressive disorder, single episode, unspecified: Secondary | ICD-10-CM | POA: Diagnosis present

## 2019-05-22 DIAGNOSIS — K219 Gastro-esophageal reflux disease without esophagitis: Secondary | ICD-10-CM | POA: Diagnosis present

## 2019-05-22 LAB — URINALYSIS, COMPLETE (UACMP) WITH MICROSCOPIC
Bacteria, UA: NONE SEEN
Bilirubin Urine: NEGATIVE
Glucose, UA: NEGATIVE mg/dL
Ketones, ur: NEGATIVE mg/dL
Nitrite: NEGATIVE
Protein, ur: 30 mg/dL — AB
Specific Gravity, Urine: 1.027 (ref 1.005–1.030)
pH: 5 (ref 5.0–8.0)

## 2019-05-22 LAB — COMPREHENSIVE METABOLIC PANEL
ALT: 15 U/L (ref 0–44)
AST: 15 U/L (ref 15–41)
Albumin: 4.2 g/dL (ref 3.5–5.0)
Alkaline Phosphatase: 39 U/L (ref 38–126)
Anion gap: 8 (ref 5–15)
BUN: 14 mg/dL (ref 6–20)
CO2: 27 mmol/L (ref 22–32)
Calcium: 9.2 mg/dL (ref 8.9–10.3)
Chloride: 102 mmol/L (ref 98–111)
Creatinine, Ser: 0.89 mg/dL (ref 0.44–1.00)
GFR calc Af Amer: >60 mL/min (ref >60)
GFR calc non Af Amer: >60 mL/min (ref >60)
Glucose, Bld: 100 mg/dL — ABNORMAL HIGH (ref 70–99)
Potassium: 3.7 mmol/L (ref 3.5–5.1)
Sodium: 137 mmol/L (ref 135–145)
Total Bilirubin: 0.7 mg/dL (ref 0.3–1.2)
Total Protein: 7.1 g/dL (ref 6.5–8.1)

## 2019-05-22 LAB — CBC
HCT: 43.2 % (ref 36.0–46.0)
Hemoglobin: 14.8 g/dL (ref 12.0–15.0)
MCH: 31.4 pg (ref 26.0–34.0)
MCHC: 34.3 g/dL (ref 30.0–36.0)
MCV: 91.7 fL (ref 80.0–100.0)
Platelets: 251 10*3/uL (ref 150–400)
RBC: 4.71 MIL/uL (ref 3.87–5.11)
RDW: 11.9 % (ref 11.5–15.5)
WBC: 12.8 10*3/uL — ABNORMAL HIGH (ref 4.0–10.5)
nRBC: 0 % (ref 0.0–0.2)

## 2019-05-22 LAB — POCT PREGNANCY, URINE: Preg Test, Ur: NEGATIVE

## 2019-05-22 LAB — LIPASE, BLOOD: Lipase: 21 U/L (ref 11–51)

## 2019-05-22 NOTE — ED Triage Notes (Signed)
Abdominal pain for 1 week with nausea.

## 2019-05-23 ENCOUNTER — Encounter: Payer: Self-pay | Admitting: Radiology

## 2019-05-23 ENCOUNTER — Emergency Department: Payer: Medicaid Other

## 2019-05-23 ENCOUNTER — Inpatient Hospital Stay: Payer: Medicaid Other

## 2019-05-23 ENCOUNTER — Inpatient Hospital Stay
Admission: EM | Admit: 2019-05-23 | Discharge: 2019-05-24 | DRG: 387 | Disposition: A | Discharge status: Home / Self Care | Payer: Medicaid Other | Attending: Internal Medicine | Admitting: Internal Medicine

## 2019-05-23 DIAGNOSIS — K56600 Partial intestinal obstruction, unspecified as to cause: Secondary | ICD-10-CM

## 2019-05-23 DIAGNOSIS — K56609 Unspecified intestinal obstruction, unspecified as to partial versus complete obstruction: Secondary | ICD-10-CM | POA: Diagnosis not present

## 2019-05-23 DIAGNOSIS — F411 Generalized anxiety disorder: Secondary | ICD-10-CM | POA: Diagnosis present

## 2019-05-23 DIAGNOSIS — Z79899 Other long term (current) drug therapy: Secondary | ICD-10-CM | POA: Diagnosis not present

## 2019-05-23 DIAGNOSIS — Z23 Encounter for immunization: Secondary | ICD-10-CM | POA: Diagnosis not present

## 2019-05-23 DIAGNOSIS — F329 Major depressive disorder, single episode, unspecified: Secondary | ICD-10-CM | POA: Diagnosis present

## 2019-05-23 DIAGNOSIS — I341 Nonrheumatic mitral (valve) prolapse: Secondary | ICD-10-CM | POA: Diagnosis present

## 2019-05-23 DIAGNOSIS — I10 Essential (primary) hypertension: Secondary | ICD-10-CM | POA: Diagnosis present

## 2019-05-23 DIAGNOSIS — Z880 Allergy status to penicillin: Secondary | ICD-10-CM | POA: Diagnosis not present

## 2019-05-23 DIAGNOSIS — K8020 Calculus of gallbladder without cholecystitis without obstruction: Secondary | ICD-10-CM

## 2019-05-23 DIAGNOSIS — Z20828 Contact with and (suspected) exposure to other viral communicable diseases: Secondary | ICD-10-CM | POA: Diagnosis present

## 2019-05-23 DIAGNOSIS — K50012 Crohn's disease of small intestine with intestinal obstruction: Secondary | ICD-10-CM | POA: Diagnosis not present

## 2019-05-23 DIAGNOSIS — Z91040 Latex allergy status: Secondary | ICD-10-CM | POA: Diagnosis not present

## 2019-05-23 DIAGNOSIS — K219 Gastro-esophageal reflux disease without esophagitis: Secondary | ICD-10-CM | POA: Diagnosis present

## 2019-05-23 DIAGNOSIS — R1011 Right upper quadrant pain: Secondary | ICD-10-CM

## 2019-05-23 DIAGNOSIS — Z975 Presence of (intrauterine) contraceptive device: Secondary | ICD-10-CM | POA: Diagnosis not present

## 2019-05-23 LAB — HEPATIC FUNCTION PANEL
ALT: 13 U/L (ref 0–44)
AST: 13 U/L — ABNORMAL LOW (ref 15–41)
Albumin: 3.5 g/dL (ref 3.5–5.0)
Alkaline Phosphatase: 30 U/L — ABNORMAL LOW (ref 38–126)
Bilirubin, Direct: 0.1 mg/dL (ref 0.0–0.2)
Indirect Bilirubin: 0.8 mg/dL (ref 0.3–0.9)
Total Bilirubin: 0.9 mg/dL (ref 0.3–1.2)
Total Protein: 5.9 g/dL — ABNORMAL LOW (ref 6.5–8.1)

## 2019-05-23 LAB — BASIC METABOLIC PANEL
Anion gap: 5 (ref 5–15)
BUN: 13 mg/dL (ref 6–20)
CO2: 25 mmol/L (ref 22–32)
Calcium: 7.9 mg/dL — ABNORMAL LOW (ref 8.9–10.3)
Chloride: 109 mmol/L (ref 98–111)
Creatinine, Ser: 0.84 mg/dL (ref 0.44–1.00)
GFR calc Af Amer: >60 mL/min (ref >60)
GFR calc non Af Amer: >60 mL/min (ref >60)
Glucose, Bld: 107 mg/dL — ABNORMAL HIGH (ref 70–99)
Potassium: 4.2 mmol/L (ref 3.5–5.1)
Sodium: 139 mmol/L (ref 135–145)

## 2019-05-23 LAB — CBC
HCT: 35.8 % — ABNORMAL LOW (ref 36.0–46.0)
Hemoglobin: 12.3 g/dL (ref 12.0–15.0)
MCH: 31.9 pg (ref 26.0–34.0)
MCHC: 34.4 g/dL (ref 30.0–36.0)
MCV: 92.7 fL (ref 80.0–100.0)
Platelets: 208 10*3/uL (ref 150–400)
RBC: 3.86 MIL/uL — ABNORMAL LOW (ref 3.87–5.11)
RDW: 11.9 % (ref 11.5–15.5)
WBC: 10 10*3/uL (ref 4.0–10.5)
nRBC: 0 % (ref 0.0–0.2)

## 2019-05-23 LAB — HCG, QUANTITATIVE, PREGNANCY: hCG, Beta Chain, Quant, S: <1 m[IU]/mL (ref <5)

## 2019-05-23 LAB — C-REACTIVE PROTEIN: CRP: <0.8 mg/dL (ref <1.0)

## 2019-05-23 LAB — SARS CORONAVIRUS 2 BY RT PCR (HOSPITAL ORDER, PERFORMED IN ~~LOC~~ HOSPITAL LAB): SARS Coronavirus 2: NEGATIVE

## 2019-05-23 MED ORDER — ADULT MULTIVITAMIN W/MINERALS CH
1.0000 | ORAL_TABLET | Freq: Every day | ORAL | Status: DC
  Administered 2019-05-23 – 2019-05-24 (×2): 1 via ORAL
  Filled 2019-05-23 (×2): qty 1

## 2019-05-23 MED ORDER — ONDANSETRON HCL 4 MG/2ML IJ SOLN
4.0000 mg | Freq: Four times a day (QID) | INTRAMUSCULAR | Status: DC | PRN
  Administered 2019-05-23: 4 mg via INTRAVENOUS
  Filled 2019-05-23: qty 2

## 2019-05-23 MED ORDER — INFLUENZA VAC SPLIT QUAD 0.5 ML IM SUSY
0.5000 mL | PREFILLED_SYRINGE | INTRAMUSCULAR | Status: AC
  Administered 2019-05-24: 11:00:00 0.5 mL via INTRAMUSCULAR
  Filled 2019-05-23: qty 0.5

## 2019-05-23 MED ORDER — KETOROLAC TROMETHAMINE 30 MG/ML IJ SOLN: 30.0000 mg | Freq: Four times a day (QID) | INTRAMUSCULAR | Status: DC | PRN

## 2019-05-23 MED ORDER — OXYCODONE HCL 5 MG PO TABS
5.0000 mg | ORAL_TABLET | ORAL | Status: DC | PRN
  Administered 2019-05-23: 5 mg via ORAL
  Filled 2019-05-23: qty 1

## 2019-05-23 MED ORDER — LABETALOL HCL 200 MG PO TABS: 200.0000 mg | ORAL_TABLET | Freq: Three times a day (TID) | ORAL | Status: DC

## 2019-05-23 MED ORDER — LEVONORGESTREL 19.5 MCG/DAY IU IUD: 1.0000 | INTRAUTERINE_SYSTEM | Freq: Once | INTRAUTERINE | Status: DC

## 2019-05-23 MED ORDER — ENOXAPARIN SODIUM 40 MG/0.4ML ~~LOC~~ SOLN
40.0000 mg | SUBCUTANEOUS | Status: DC
  Administered 2019-05-23: 40 mg via SUBCUTANEOUS
  Filled 2019-05-23: qty 0.4

## 2019-05-23 MED ORDER — SODIUM CHLORIDE 0.9 % IV SOLN
Freq: Once | INTRAVENOUS | Status: AC
  Administered 2019-05-23: 03:00:00 via INTRAVENOUS

## 2019-05-23 MED ORDER — ACETAMINOPHEN 325 MG PO TABS
650.0000 mg | ORAL_TABLET | Freq: Four times a day (QID) | ORAL | Status: DC | PRN
  Administered 2019-05-23 (×3): 650 mg via ORAL
  Filled 2019-05-23 (×3): qty 2

## 2019-05-23 MED ORDER — ACETAMINOPHEN 650 MG RE SUPP: 650.0000 mg | Freq: Four times a day (QID) | RECTAL | Status: DC | PRN

## 2019-05-23 MED ORDER — PANTOPRAZOLE SODIUM 40 MG PO TBEC: 40.0000 mg | DELAYED_RELEASE_TABLET | Freq: Every day | ORAL | Status: DC

## 2019-05-23 MED ORDER — SODIUM CHLORIDE 0.9 % IV BOLUS
1000.0000 mL | Freq: Once | INTRAVENOUS | Status: AC
  Administered 2019-05-23: 1000 mL via INTRAVENOUS

## 2019-05-23 MED ORDER — ONDANSETRON HCL 4 MG PO TABS: 4.0000 mg | ORAL_TABLET | Freq: Four times a day (QID) | ORAL | Status: DC | PRN

## 2019-05-23 MED ORDER — SODIUM CHLORIDE 0.9 % IV SOLN
1.0000 g | INTRAVENOUS | Status: DC
  Administered 2019-05-23: 06:00:00 1 g via INTRAVENOUS
  Filled 2019-05-23: qty 1

## 2019-05-23 MED ORDER — ONDANSETRON HCL 4 MG/2ML IJ SOLN
4.0000 mg | Freq: Once | INTRAMUSCULAR | Status: AC
  Administered 2019-05-23: 4 mg via INTRAVENOUS
  Filled 2019-05-23: qty 2

## 2019-05-23 MED ORDER — FLUOXETINE HCL 10 MG PO CAPS
10.0000 mg | ORAL_CAPSULE | Freq: Every day | ORAL | Status: DC
  Administered 2019-05-23 – 2019-05-24 (×2): 10 mg via ORAL
  Filled 2019-05-23 (×2): qty 1

## 2019-05-23 MED ORDER — TRAZODONE HCL 50 MG PO TABS
25.0000 mg | ORAL_TABLET | Freq: Every evening | ORAL | Status: DC | PRN
  Administered 2019-05-23: 25 mg via ORAL
  Filled 2019-05-23: qty 1

## 2019-05-23 MED ORDER — MORPHINE SULFATE (PF) 4 MG/ML IV SOLN
4.0000 mg | Freq: Once | INTRAVENOUS | Status: AC
  Administered 2019-05-23: 4 mg via INTRAVENOUS
  Filled 2019-05-23: qty 1

## 2019-05-23 MED ORDER — FERROUS SULFATE 325 (65 FE) MG PO TABS: 325.0000 mg | ORAL_TABLET | Freq: Two times a day (BID) | ORAL | Status: DC

## 2019-05-23 MED ORDER — SODIUM CHLORIDE 0.9 % IV SOLN
INTRAVENOUS | Status: DC
  Administered 2019-05-23 – 2019-05-24 (×3): via INTRAVENOUS

## 2019-05-23 MED ORDER — KETOROLAC TROMETHAMINE 30 MG/ML IJ SOLN
15.0000 mg | Freq: Once | INTRAMUSCULAR | Status: AC
  Administered 2019-05-23: 15 mg via INTRAVENOUS
  Filled 2019-05-23: qty 1

## 2019-05-23 MED ORDER — IOHEXOL 300 MG/ML  SOLN
100.0000 mL | Freq: Once | INTRAMUSCULAR | Status: AC | PRN
  Administered 2019-05-23: 100 mL via INTRAVENOUS

## 2019-05-23 MED ORDER — FENTANYL CITRATE (PF) 100 MCG/2ML IJ SOLN
50.0000 ug | Freq: Once | INTRAMUSCULAR | Status: AC
  Administered 2019-05-23: 50 ug via INTRAVENOUS
  Filled 2019-05-23: qty 2

## 2019-05-23 NOTE — Progress Notes (Signed)
Initial Nutrition Assessment  DOCUMENTATION CODES:   Obesity unspecified  INTERVENTION:   RD will monitor for diet advancement vs the need for nutrition support   NUTRITION DIAGNOSIS:   Inadequate oral intake related to acute illness(SBO) as evidenced by NPO status.  GOAL:   Patient will meet greater than or equal to 90% of their needs  MONITOR:   Diet advancement, Labs, Weight trends, Skin, I & O's  REASON FOR ASSESSMENT:   Malnutrition Screening Tool    ASSESSMENT:   33 y.o. female with a known history of Crohn's disease, hypertension, depression, GERD, mitral valve prolapse, anxiety and new cholelithiasis admitted with partial SBO  RD working remotely.  Pt with decreased appetite, nausea and abdominal pain for for 1 week pta. Pt reports her nausea has improved today. Pt is continuing to have abdominal pain. Pt did have a BM yesterday. Pt currently NPO. RD will monitor for diet advancement vs the need for nutrition support. Per chart, pt appears fairly weight stable at baseline.   Medications reviewed and include: lovenox, MVI, NaCl @ 133ml/hr  Labs reviewed:   Unable to complete Nutrition-Focused physical exam at this time.   Diet Order:   Diet Order            Diet NPO time specified Except for: Sips with Meds  Diet effective now             EDUCATION NEEDS:   No education needs have been identified at this time  Skin:  Skin Assessment: Reviewed RN Assessment  Last BM:  9/13  Height:   Ht Readings from Last 1 Encounters:  05/23/19 5\' 7"  (1.702 m)    Weight:   Wt Readings from Last 1 Encounters:  05/23/19 94.5 kg    Ideal Body Weight:  61.36 kg  BMI:  Body mass index is 32.63 kg/m.  Estimated Nutritional Needs:   Kcal:  1900-2200kg/day  Protein:  95-110g/day  Fluid:  >1.9L/day  Koleen Distance MS, RD, LDN Pager #- [phone] Office#- [phone] After Hours Pager: [phone]

## 2019-05-23 NOTE — ED Notes (Signed)
Pt to US at this time.

## 2019-05-23 NOTE — Consult Note (Signed)
Angela Braun , MD [address] [place], [place], [place], [address] [address], [place], [place], [place], [address] Phone: [phone]  Fax: [phone]  Consultation  Referring Provider:   Dr Brett Albino Primary Care Physician:  Patient, No Pcp Per Primary Gastroenterologist:   None Reason for Consultation: Crohn's disease  Date of Admission:  05/23/2019 Date of Consultation:  05/23/2019         HPI:   Angela Braun is a 33 y.o. female carries a diagnosis of Crohn's disease, hypertension and GERD.  She presented to the emergency room on 05/23/2019 with abdominal pain, diarrhea.  In the emergency room she underwent a CT scan of the abdomen that demonstrated multiple dilated loops of the small bowel without definite obstructive lesion.  Transition zone is noted in the right mid abdomen consistent with mild partial small bowel obstruction.  Right upper quadrant ultrasound shows cholelithiasis and mild common bile duct prominence without definitive stone.  Review of epic does not provide any past GI encounters.  I reviewed the same in care everywhere as well.  On admission hemoglobin 12.3 g with a platelet count of 208, creatinine 0.84.  Testing for COVID negative.  The patient has been seen by surgery.  She states that she has been diagnosed with Crohn's disease at the age of 37 which is over 14 years back.  She says that she took medications for about 2 to 3 years which involved steroids and other tablets and subsequently stopped.  She was doing fine till about 2 months back when she started developing right-sided abdominal pain.  It was not associated with any other symptoms but yesterday she developed abdominal distention vomiting and worsening of the abdominal pain which brought her to the emergency room.  She had a bowel movement yesterday none today.  Not passing much gas today but she states that she feels much better today denies any abdominal pain.  Denies any NSAID use.  Denies any  illegal drug use.  She recollects that her Crohn's disease affected the terminal ileum when diagnosed at age of 57.  Past Medical History:  Diagnosis Date   Abnormal Pap smear of cervix    Crohn disease ([place])    Migraine    Mitral valve prolapse     Past Surgical History:  Procedure Laterality Date   CESAREAN SECTION N/A 06/16/2018   Procedure: CESAREAN SECTION;  Surgeon: Will Bonnet, MD;  Location: [facility] ORS;  Service: Obstetrics;  Laterality: N/A;   COLONOSCOPY      Prior to Admission medications   Medication Sig Start Date End Date Taking? Authorizing Provider  FLUoxetine (PROZAC) 10 MG tablet Take 1 tablet (10 mg total) by mouth daily. 04/13/19  Yes Will Bonnet, MD  ferrous sulfate 325 (65 FE) MG tablet Take 1 tablet (325 mg total) by mouth 2 (two) times daily with a meal. Patient not taking: Reported on 09/06/2018 06/19/18   Homero Fellers, MD  labetalol (NORMODYNE) 200 MG tablet Take 1 tablet (200 mg total) by mouth 3 (three) times daily. Patient not taking: Reported on 09/06/2018 06/19/18   Homero Fellers, MD  Levonorgestrel (LILETTA, 52 MG,) 19.5 MCG/DAY IUD IUD 1 Intra Uterine Device (1 each total) by Intrauterine route once for 1 dose. 07/27/18 07/27/18  Will Bonnet, MD  omeprazole (PRILOSEC) 40 MG capsule Take 1 capsule (40 mg total) by mouth daily. Patient not taking: Reported on 09/06/2018 05/06/18   Rexene Agent, CNM  Prenatal  Vit-Fe Fumarate-FA (MULTIVITAMIN-PRENATAL) 27-0.8 MG TABS tablet Take 1 tablet by mouth daily at 12 noon.    [provider]    Family History  Problem Relation Age of Onset   Ovarian cancer Maternal Aunt      Social History   Tobacco Use   Smoking status: Never Smoker   Smokeless tobacco: Never Used  Substance Use Topics   Alcohol use: No    Frequency: Never   Drug use: No    Allergies as of 05/22/2019 - Review Complete 05/22/2019  Allergen Reaction Noted   Penicillins Hives  05/26/2014   Latex Rash 06/16/2018    Review of Systems:    All systems reviewed and negative except where noted in HPI.   Physical Exam:  Vital signs in last 24 hours: Temp:  [97.7 F (36.5 C)-98.4 F (36.9 C)] 97.7 F (36.5 C) (09/14 0509) Pulse Rate:  [66-118] 88 (09/14 0509) Resp:  [16-18] 18 (09/14 0509) BP: (99-145)/(64-89) 99/65 (09/14 0509) SpO2:  [98 %-100 %] 100 % (09/14 0509) Weight:  [94.5 kg-97.5 kg] 94.5 kg (09/14 0509) Last BM Date: 05/22/19 General:   Pleasant, cooperative in NAD Head:  Normocephalic and atraumatic. Eyes:   No icterus.   Conjunctiva pink. PERRLA. Ears:  Normal auditory acuity. Neck:  Supple; no masses or thyroidomegaly Lungs: Respirations even and unlabored. Lungs clear to auscultation bilaterally.   No wheezes, crackles, or rhonchi.  Heart:  Regular rate and rhythm;  Without murmur, clicks, rubs or gallops Abdomen:  Soft, nondistended, nontender. Normal bowel sounds. No appreciable masses or hepatomegaly.  No rebound or guarding.  Neurologic:  Alert and oriented x3;  grossly normal neurologically. Skin:  Intact without significant lesions or rashes. Cervical Nodes:  No significant cervical adenopathy. Psych:  Alert and cooperative. Normal affect.  LAB RESULTS: Recent Labs    05/22/19 2252 05/23/19 0527  WBC 12.8* 10.0  HGB 14.8 12.3  HCT 43.2 35.8*  PLT 251 208   BMET Recent Labs    05/22/19 2252 05/23/19 0527  NA 137 139  K 3.7 4.2  CL 102 109  CO2 27 25  GLUCOSE 100* 107*  BUN 14 13  CREATININE 0.89 0.84  CALCIUM 9.2 7.9*   LFT Recent Labs    05/22/19 2252  PROT 7.1  ALBUMIN 4.2  AST 15  ALT 15  ALKPHOS 39  BILITOT 0.7   PT/INR No results for input(s): LABPROT, INR in the last 72 hours.  STUDIES: Ct Abdomen Pelvis W Contrast  Result Date: 05/23/2019 CLINICAL DATA:  Abdominal pain for 1 week EXAM: CT ABDOMEN AND PELVIS WITH CONTRAST TECHNIQUE: Multidetector CT imaging of the abdomen and pelvis was performed  using the standard protocol following bolus administration of intravenous contrast. CONTRAST:  152m OMNIPAQUE IOHEXOL 300 MG/ML  SOLN COMPARISON:  Ultrasound from earlier in the same day. FINDINGS: Lower chest: No acute abnormality. Hepatobiliary: Gallbladder is within normal limits. The known gallstones are not well appreciated on this exam. Liver is within normal limits. Minimal perihepatic fluid is noted. Pancreas: Unremarkable. No pancreatic ductal dilatation or surrounding inflammatory changes. Spleen: Normal in size without focal abnormality. Adrenals/Urinary Tract: Adrenal glands are within normal limits. Kidneys are well visualized bilaterally within normal enhancement pattern. Tiny nonobstructing left renal stone is noted. Bladder is partially distended. Stomach/Bowel: Colon is decompressed without inflammatory change. The appendix is within normal limits. Stomach is within normal limits. The distal jejunum and proximal and mid ileal dilatation is seen with a transition zone in the  right lower quadrant without focal mass. Multiple hyperdense areas are identified throughout the dilated small bowel likely related to ingested content. Vascular/Lymphatic: No significant vascular findings are present. No enlarged abdominal or pelvic lymph nodes. Reproductive: Uterus and bilateral adnexa are unremarkable. An IUD is noted in place. Other: Mild free fluid is noted within the pelvis. This may be physiologic in nature but may be related to the dilated small bowel in a reactive manner. Musculoskeletal: No acute or significant osseous findings. IMPRESSION: Multiple dilated loops of small bowel without definitive obstructive lesion. A transition zone is noted in the right mid abdomen. This is consistent with a mild partial small bowel obstruction. No other focal abnormality is noted. Electronically Signed   By: Inez Catalina M.D.   On: 05/23/2019 01:49   US Abdomen Limited Ruq  Result Date: 05/23/2019 CLINICAL DATA:   Right upper quadrant pain EXAM: ULTRASOUND ABDOMEN LIMITED RIGHT UPPER QUADRANT COMPARISON:  11/20/2015 FINDINGS: Gallbladder: Gallbladder is well distended with evidence of cholelithiasis. No wall thickening or pericholecystic fluid is noted. Common bile duct: Diameter: 7.2 mm. No definitive stone is seen although this is greater than that expected for the patient's age. Correlation with laboratory values is recommended. Liver: No focal lesion identified. Within normal limits in parenchymal echogenicity. Portal vein is patent on color Doppler imaging with normal direction of blood flow towards the liver. Other: None. IMPRESSION: Cholelithiasis. Mild CBD prominence without definitive stone. Correlation with laboratory values is recommended. Electronically Signed   By: Inez Catalina M.D.   On: 05/23/2019 01:06      Impression / Plan:   Angela Braun is a 33 y.o. y/o female with with a history of Crohn's of the small bowel diagnosed at age of 20.  She took treatment for 2 or 3 years.  Subsequently has not followed up.  She seems to been doing reasonably well over the years.  Over the last 2 months developed right-sided abdominal pain and on admission found to have a partial small bowel obstruction.  Treated conservatively and she feels much better today.  She has no vomiting she has no abdominal pain and her labs look normal with a white cell count of 10 and a platelet count of 208 suggesting no significant inflammation at this point of time.  I do not see a reason to commence her on steroids at this point of time.  Plan 1.  Continue conservative management for partial small bowel obstruction with IV fluids 2.  Would suggest to obtain CT enterography to rule out any strictures of the small bowel which can be done as an outpatient as long as she is able to tolerate her diet orally and able to advance adequately. 3.  Obtain CRP and fecal calprotectin.  Check stool for GI PCR and C. difficile if has diarrhea  which she has none now  4.  Hepatic function panel in view of dilated common bile duct.  As an outpatient can probably evaluate with an MRCP.  There is an association with primary sclerosing cholangitis and inflammatory bowel disease.   Thank you for involving me in the care of this patient.      LOS: 0 days   Angela Bellows, MD  05/23/2019, 8:28 AM

## 2019-05-23 NOTE — Progress Notes (Signed)
[place] [facility] [place] NAME: Angela Braun    MR#:  [id number]  DATE OF BIRTH:  [birth year]  SUBJECTIVE:   Patient states she is feeling about the same today.  Her vomiting has improved.  Her abdominal pain has moved from her right upper quadrant to her left lower quadrant.  She has not been passing flatus.  Last bowel movement was overnight in the ED.  REVIEW OF SYSTEMS:  Review of Systems  Constitutional: Negative for chills and fever.  HENT: Negative for congestion and sore throat.   Eyes: Negative for blurred vision and double vision.  Respiratory: Negative for cough and shortness of breath.   Cardiovascular: Negative for chest pain and palpitations.  Gastrointestinal: Positive for abdominal pain, nausea and vomiting.  Genitourinary: Negative for dysuria and urgency.  Musculoskeletal: Negative for back pain and neck pain.  Neurological: Negative for dizziness and headaches.  Psychiatric/Behavioral: Negative for depression. The patient is not nervous/anxious.     DRUG ALLERGIES:   Allergies  Allergen Reactions  . Penicillins Hives  . Latex Rash   VITALS:  Blood pressure 99/65, pulse 88, temperature 97.7 F (36.5 C), temperature source Oral, resp. rate 18, height 5' 7"  (1.702 m), weight 94.5 kg, SpO2 100 %, not currently breastfeeding. PHYSICAL EXAMINATION:  Physical Exam  GENERAL:  Laying in the bed with no acute distress.  HEENT: Head atraumatic, normocephalic. Pupils equal, round, reactive to light and accommodation. No scleral icterus. Extraocular muscles intact. Oropharynx and nasopharynx clear.  NECK:  Supple, no jugular venous distention. No thyroid enlargement. LUNGS: Lungs are clear to auscultation bilaterally. No wheezes, crackles, rhonchi. No use of accessory muscles of respiration.  CARDIOVASCULAR: RRR, S1, S2 normal. No murmurs, rubs, or gallops.  ABDOMEN: Soft, nondistended. Bowel sounds present. +LLQ abdominal pain,  no rebound or guarding. EXTREMITIES: No pedal edema, cyanosis, or clubbing.  NEUROLOGIC: CN 2-12 intact, no focal deficits. 5/5 muscle strength throughout all extremities. Sensation intact throughout. Gait not checked.  PSYCHIATRIC: The patient is alert and oriented x 3.  SKIN: No obvious rash, lesion, or ulcer.  LABORATORY PANEL:  Female CBC Recent Labs  Lab 05/23/19 0527  WBC 10.0  HGB 12.3  HCT 35.8*  PLT 208   ------------------------------------------------------------------------------------------------------------------ Chemistries  Recent Labs  Lab 05/23/19 0527  NA 139  K 4.2  CL 109  CO2 25  GLUCOSE 107*  BUN 13  CREATININE 0.84  CALCIUM 7.9*  AST 13*  ALT 13  ALKPHOS 30*  BILITOT 0.9   RADIOLOGY:  Ct Abdomen Pelvis W Contrast  Result Date: 05/23/2019 CLINICAL DATA:  Abdominal pain for 1 week EXAM: CT ABDOMEN AND PELVIS WITH CONTRAST TECHNIQUE: Multidetector CT imaging of the abdomen and pelvis was performed using the standard protocol following bolus administration of intravenous contrast. CONTRAST:  156m OMNIPAQUE IOHEXOL 300 MG/ML  SOLN COMPARISON:  Ultrasound from earlier in the same day. FINDINGS: Lower chest: No acute abnormality. Hepatobiliary: Gallbladder is within normal limits. The known gallstones are not well appreciated on this exam. Liver is within normal limits. Minimal perihepatic fluid is noted. Pancreas: Unremarkable. No pancreatic ductal dilatation or surrounding inflammatory changes. Spleen: Normal in size without focal abnormality. Adrenals/Urinary Tract: Adrenal glands are within normal limits. Kidneys are well visualized bilaterally within normal enhancement pattern. Tiny nonobstructing left renal stone is noted. Bladder is partially distended. Stomach/Bowel: Colon is decompressed without inflammatory change. The appendix is within normal limits. Stomach is within normal limits. The distal jejunum and  proximal and mid ileal dilatation is seen with  a transition zone in the right lower quadrant without focal mass. Multiple hyperdense areas are identified throughout the dilated small bowel likely related to ingested content. Vascular/Lymphatic: No significant vascular findings are present. No enlarged abdominal or pelvic lymph nodes. Reproductive: Uterus and bilateral adnexa are unremarkable. An IUD is noted in place. Other: Mild free fluid is noted within the pelvis. This may be physiologic in nature but may be related to the dilated small bowel in a reactive manner. Musculoskeletal: No acute or significant osseous findings. IMPRESSION: Multiple dilated loops of small bowel without definitive obstructive lesion. A transition zone is noted in the right mid abdomen. This is consistent with a mild partial small bowel obstruction. No other focal abnormality is noted. Electronically Signed   By: Inez Catalina M.D.   On: 05/23/2019 01:49   Dg Abd 2 Views  Result Date: 05/23/2019 CLINICAL DATA:  Small bowel obstruction EXAM: ABDOMEN - 2 VIEW COMPARISON:  CT abdomen and pelvis May 23, 2019. FINDINGS: Supine and upright images were obtained. There is moderate stool in the colon. There is currently no appreciable bowel dilatation or air-fluid level to suggest bowel obstruction. No free air. Lung bases are clear. Contrast is present in the urinary bladder. There is an intrauterine device within the pelvis. IMPRESSION: On the current examination, there is no bowel dilatation or air-fluid level to suggest bowel obstruction. No free air. Lung bases are clear. Intrauterine device noted in the pelvis. Electronically Signed   By: Lowella Grip III M.D.   On: 05/23/2019 09:07   US Abdomen Limited Ruq  Result Date: 05/23/2019 CLINICAL DATA:  Right upper quadrant pain EXAM: ULTRASOUND ABDOMEN LIMITED RIGHT UPPER QUADRANT COMPARISON:  11/20/2015 FINDINGS: Gallbladder: Gallbladder is well distended with evidence of cholelithiasis. No wall thickening or  pericholecystic fluid is noted. Common bile duct: Diameter: 7.2 mm. No definitive stone is seen although this is greater than that expected for the patient's age. Correlation with laboratory values is recommended. Liver: No focal lesion identified. Within normal limits in parenchymal echogenicity. Portal vein is patent on color Doppler imaging with normal direction of blood flow towards the liver. Other: None. IMPRESSION: Cholelithiasis. Mild CBD prominence without definitive stone. Correlation with laboratory values is recommended. Electronically Signed   By: Inez Catalina M.D.   On: 05/23/2019 01:06   ASSESSMENT AND PLAN:   Partial small bowel obstruction- resolving.  Patient did have a bowel movement in the ED. -Repeat abdominal x-ray this morning did not show any evidence of a bowel obstruction -Continue IV fluids -N.p.o. for now, can likely start clears tomorrow -Surgery following -Pain control  Crohn's disease- possible flare, given her increased abdominal pain and diarrhea. -GI consulted -Pain control  Cholelithiasis- seen on right upper quadrant ultrasound.  Patient also has mild CBD prominence without definitive stone.  Total bili is normal. -Monitor  Possible UTI-initially placed on ceftriaxone, although I doubt patient has a true UTI.  She is asymptomatic -Will stop antibiotics -Urine culture pending  Anxiety disorder- stable -Continue home Prozac  DVT prophylaxis-Lovenox  All the records are reviewed and case discussed with Care Management/Social Worker. Management plans discussed with the patient, family and they are in agreement.  CODE STATUS: Full Code  TOTAL TIME TAKING CARE OF THIS PATIENT: 45 minutes.   More than 50% of the time was spent in counseling/coordination of care: YES  POSSIBLE D/C IN 1-2 DAYS, DEPENDING ON CLINICAL CONDITION.   Berna Spare Mayo  M.D on 05/23/2019 at 1:15 PM  Between 7am to 6pm - Pager - [phone]  After 6pm go to [email or web address] -  Proofreader  [facility]  Office  [phone]  CC: Primary care physician; Patient, No Pcp Per  Note: This dictation was prepared with Dragon dictation along with smaller phrase technology. Any transcriptional errors that result from this process are unintentional.

## 2019-05-23 NOTE — ED Notes (Signed)
Primary RN notified of pt arrival in room. Pt provided with gown tochange, call bell at right side.

## 2019-05-23 NOTE — H&P (Addendum)
[place] [place] NAME: Angela Braun    MR#:  [id number]  DATE OF BIRTH:  March 04, 1986  DATE OF ADMISSION:  05/23/2019  PRIMARY CARE PHYSICIAN: Patient, No Pcp Per   REQUESTING/REFERRING PHYSICIAN: Gonzella Lex, MD CHIEF COMPLAINT:   Chief Complaint  Patient presents with   Abdominal Pain    HISTORY OF PRESENT ILLNESS:  Angela Braun  is a 34 y.o. pleasant Caucasian female with a known history of Crohn's disease, hypertension depression as well as GERD as well as mitral valve prolapse and anxiety, who presented to the emergency room with acute onset of generalized abdominal pain which has been on and off for the last week with associated nausea.  She had vomiting today.  She has been having diarrhea with loose bowel movements and her last bowel movement positive 10 PM.  She denied any dysuria, oliguria or hematuria or flank pain.  No melena or bright red bleeding per rectum.  No bilious vomitus or hematemesis.  No other bleeding diathesis.  She admitted to cold sweats with have measured fever.  No cough or wheezing.  No recent sick exposure to COVID-19.  Upon presentation to the emergency room, blood pressure was 145/70 with a pulse of 118 and otherwise normal vital signs.  Labs revealed mild leukocytosis 12.8 and were otherwise unremarkable.  Urinalysis with no showed 6-10 RBCs with 11-20 WBCs.  Abdominal and pelvic CT scan revealed multiple dilated loops of small bowel without definite obstructive lesion.  A transition note was noted in the right mid abdomen.  This is consistent with a mild partial small bowel obstruction.  The patient was kept n.p.o. and was given hydration with 1 L bolus of IV normal saline follow-up 150 mL/h, 15 mg of IV Toradol, 4 mg of IV Zofran and 4 mg of IV morphine sulfate in 50 mcg of IV fentanyl.  She will be admitted to a medical bed for further evaluation and management. PAST MEDICAL HISTORY:   Past  Medical History:  Diagnosis Date   Abnormal Pap smear of cervix    Crohn disease ([place])    Migraine    Mitral valve prolapse   Hypertension, depressio/anxiety and GERD  PAST SURGICAL HISTORY:   Past Surgical History:  Procedure Laterality Date   CESAREAN SECTION N/A 06/16/2018   Procedure: CESAREAN SECTION;  Surgeon: Will Bonnet, MD;  Location: [facility] ORS;  Service: Obstetrics;  Laterality: N/A;   COLONOSCOPY      SOCIAL HISTORY:   Social History   Tobacco Use   Smoking status: Never Smoker   Smokeless tobacco: Never Used  Substance Use Topics   Alcohol use: No    Frequency: Never    FAMILY HISTORY:   Family History  Problem Relation Age of Onset   Ovarian cancer Maternal Aunt     DRUG ALLERGIES:   Allergies  Allergen Reactions   Penicillins Hives   Latex Rash    REVIEW OF SYSTEMS:   ROS As per history of present illness. All pertinent systems were reviewed above. Constitutional,  HEENT, cardiovascular, respiratory, GI, GU, musculoskeletal, neuro, psychiatric, endocrine,  integumentary and hematologic systems were reviewed and are otherwise  negative/unremarkable except for positive findings mentioned above in the HPI.   MEDICATIONS AT HOME:   Prior to Admission medications   Medication Sig Start Date End Date Taking? Authorizing Provider  FLUoxetine (PROZAC) 10 MG tablet Take 1 tablet (10 mg total) by mouth daily. 04/13/19  Yes  Will Bonnet, MD  ferrous sulfate 325 (65 FE) MG tablet Take 1 tablet (325 mg total) by mouth 2 (two) times daily with a meal. Patient not taking: Reported on 09/06/2018 06/19/18   Homero Fellers, MD  labetalol (NORMODYNE) 200 MG tablet Take 1 tablet (200 mg total) by mouth 3 (three) times daily. Patient not taking: Reported on 09/06/2018 06/19/18   Homero Fellers, MD  Levonorgestrel (LILETTA, 52 MG,) 19.5 MCG/DAY IUD IUD 1 Intra Uterine Device (1 each total) by Intrauterine route once for 1 dose.  07/27/18 07/27/18  Will Bonnet, MD  omeprazole (PRILOSEC) 40 MG capsule Take 1 capsule (40 mg total) by mouth daily. Patient not taking: Reported on 09/06/2018 05/06/18   Rexene Agent, CNM  Prenatal Vit-Fe Fumarate-FA (MULTIVITAMIN-PRENATAL) 27-0.8 MG TABS tablet Take 1 tablet by mouth daily at 12 noon.    [provider]      VITAL SIGNS:  Blood pressure (!) 145/73, pulse (!) 118, temperature 98.4 F (36.9 C), temperature source Oral, resp. rate 18, height 5' 8"  (1.727 m), weight 97.5 kg, SpO2 100 %, not currently breastfeeding.  PHYSICAL EXAMINATION:  Physical Exam  GENERAL:  33 y.o.-year-old pleasant Caucasian female patient lying in the bed with no acute distress.  EYES: Pupils equal, round, reactive to light and accommodation. No scleral icterus. Extraocular muscles intact.  HEENT: Head atraumatic, normocephalic. Oropharynx and nasopharynx clear.  NECK:  Supple, no jugular venous distention. No thyroid enlargement, no tenderness.  LUNGS: Normal breath sounds bilaterally, no wheezing, rales,rhonchi or crepitation. No use of accessory muscles of respiration.  CARDIOVASCULAR: Regular rate and rhythm, S1, S2 normal. No murmurs, rubs, or gallops.  ABDOMEN: Soft, nondistended with generalized abdominal tenderness mainly in the epigastric and left lower quadrant area without rebound tenderness guarding rigidity.  Bowel sounds present. No organomegaly or mass.  EXTREMITIES: No pedal edema, cyanosis, or clubbing.  NEUROLOGIC: Cranial nerves II through XII are intact. Muscle strength 5/5 in all extremities. Sensation intact. Gait not checked.  PSYCHIATRIC: The patient is alert and oriented x 3.  Normal affect and good eye contact. SKIN: No obvious rash, lesion, or ulcer.   LABORATORY PANEL:   CBC Recent Labs  Lab 05/22/19 2252  WBC 12.8*  HGB 14.8  HCT 43.2  PLT 251    ------------------------------------------------------------------------------------------------------------------  Chemistries  Recent Labs  Lab 05/22/19 2252  NA 137  K 3.7  CL 102  CO2 27  GLUCOSE 100*  BUN 14  CREATININE 0.89  CALCIUM 9.2  AST 15  ALT 15  ALKPHOS 39  BILITOT 0.7   ------------------------------------------------------------------------------------------------------------------  Cardiac Enzymes No results for input(s): TROPONINI in the last 168 hours. ------------------------------------------------------------------------------------------------------------------  RADIOLOGY:  Ct Abdomen Pelvis W Contrast  Result Date: 05/23/2019 CLINICAL DATA:  Abdominal pain for 1 week EXAM: CT ABDOMEN AND PELVIS WITH CONTRAST TECHNIQUE: Multidetector CT imaging of the abdomen and pelvis was performed using the standard protocol following bolus administration of intravenous contrast. CONTRAST:  140m OMNIPAQUE IOHEXOL 300 MG/ML  SOLN COMPARISON:  Ultrasound from earlier in the same day. FINDINGS: Lower chest: No acute abnormality. Hepatobiliary: Gallbladder is within normal limits. The known gallstones are not well appreciated on this exam. Liver is within normal limits. Minimal perihepatic fluid is noted. Pancreas: Unremarkable. No pancreatic ductal dilatation or surrounding inflammatory changes. Spleen: Normal in size without focal abnormality. Adrenals/Urinary Tract: Adrenal glands are within normal limits. Kidneys are well visualized bilaterally within normal enhancement pattern. Tiny nonobstructing left renal stone is noted. Bladder  is partially distended. Stomach/Bowel: Colon is decompressed without inflammatory change. The appendix is within normal limits. Stomach is within normal limits. The distal jejunum and proximal and mid ileal dilatation is seen with a transition zone in the right lower quadrant without focal mass. Multiple hyperdense areas are identified throughout  the dilated small bowel likely related to ingested content. Vascular/Lymphatic: No significant vascular findings are present. No enlarged abdominal or pelvic lymph nodes. Reproductive: Uterus and bilateral adnexa are unremarkable. An IUD is noted in place. Other: Mild free fluid is noted within the pelvis. This may be physiologic in nature but may be related to the dilated small bowel in a reactive manner. Musculoskeletal: No acute or significant osseous findings. IMPRESSION: Multiple dilated loops of small bowel without definitive obstructive lesion. A transition zone is noted in the right mid abdomen. This is consistent with a mild partial small bowel obstruction. No other focal abnormality is noted. Electronically Signed   By: Inez Catalina M.D.   On: 05/23/2019 01:49   US Abdomen Limited Ruq  Result Date: 05/23/2019 CLINICAL DATA:  Right upper quadrant pain EXAM: ULTRASOUND ABDOMEN LIMITED RIGHT UPPER QUADRANT COMPARISON:  11/20/2015 FINDINGS: Gallbladder: Gallbladder is well distended with evidence of cholelithiasis. No wall thickening or pericholecystic fluid is noted. Common bile duct: Diameter: 7.2 mm. No definitive stone is seen although this is greater than that expected for the patient's age. Correlation with laboratory values is recommended. Liver: No focal lesion identified. Within normal limits in parenchymal echogenicity. Portal vein is patent on color Doppler imaging with normal direction of blood flow towards the liver. Other: None. IMPRESSION: Cholelithiasis. Mild CBD prominence without definitive stone. Correlation with laboratory values is recommended. Electronically Signed   By: Inez Catalina M.D.   On: 05/23/2019 01:06      IMPRESSION AND PLAN:   1.  Partial small bowel obstruction with history of Crohn's disease, with associated diarrhea that could be related to a mild flare. -The patient will be admitted to a medical bed. -She will be kept n.p.o.  Will place on hydration with IV  normal saline. -Will follow two-view abdomen x-ray in a.m. -A general surgery consult will be obtained by Dr. Dahlia Byes who was notified about the patient. -Pain management will be provided with PRN IV Toradol and morphine sulfate. _GI consultation will be obtained to follow-up on her Crohn's for possible flare given her diarrhea.  Dr. Allen Norris was notified about the patient  2.  Possible UTI. -The patient will be placed on IV Rocephin for now and urine culture and sensitivity will be obtained.  3.  Anxiety disorder -We will continue fluoxetine.  4.  DVT prophylaxis. -Subcutaneous Lovenox.   All the records are reviewed and case discussed with ED provider. The plan of care was discussed in details with the patient (and family). I answered all questions. The patient agreed to proceed with the above mentioned plan. Further management will depend upon hospital course.   CODE STATUS: Full code  TOTAL TIME TAKING CARE OF THIS PATIENT: 45 minutes.    Christel Mormon M.D on 05/23/2019 at 2:45 AM  Pager - [phone]  After 6pm go to [email or web address] - Proofreader  [facility]  Office  [phone]  CC: Primary care physician; Patient, No Pcp Per   Note: This dictation was prepared with Dragon dictation along with smaller phrase technology. Any transcriptional errors that result from this process are unintentional.

## 2019-05-23 NOTE — ED Provider Notes (Signed)
[facility] Emergency Department Provider Note  ____________________________________________  Time seen: Approximately 12:49 AM  I have reviewed the triage vital signs and the nursing notes.   HISTORY  Chief Complaint Abdominal Pain   HPI Angela Braun is a 33 y.o. female with history of Crohn's disease not on any treatment, migraine headaches, and anxiety who presents for evaluation of abdominal pain.  Patient reports intermittent abdominal pain for a year.  Over the last week the pain has become more pronounced and constant over the last 24 hours.  She has had severe nausea and intermittent episodes of nonbloody nonbilious emesis.  She has had some diarrhea as well.  The pain is mostly in the right upper quadrant and radiates to the rest of her abdomen, the pain is sharp, worse postprandially.  No fever or chills.  She reports urinary frequency but no dysuria or hematuria.  No discharge.  She has an IUD in place.  No prior abdominal surgeries other than a C-section.  No chest pain or shortness of breath.   Patient diagnosed with Crohn's disease when she was 34 years old.  Has not received any treatment, needed hospitalization, or seen her GI doctor since then.  Past Medical History:  Diagnosis Date   Abnormal Pap smear of cervix    Crohn disease ([place])    Migraine    Mitral valve prolapse     Patient Active Problem List   Diagnosis Date Noted   Generalized anxiety disorder 10/01/2018    Past Surgical History:  Procedure Laterality Date   CESAREAN SECTION N/A 06/16/2018   Procedure: CESAREAN SECTION;  Surgeon: Will Bonnet, MD;  Location: [facility] ORS;  Service: Obstetrics;  Laterality: N/A;   COLONOSCOPY      Prior to Admission medications   Medication Sig Start Date End Date Taking? Authorizing Provider  ferrous sulfate 325 (65 FE) MG tablet Take 1 tablet (325 mg total) by mouth 2 (two) times daily with a meal. Patient not taking:  Reported on 09/06/2018 06/19/18   Homero Fellers, MD  FLUoxetine (PROZAC) 10 MG tablet Take 1 tablet (10 mg total) by mouth daily. 04/13/19   Will Bonnet, MD  labetalol (NORMODYNE) 200 MG tablet Take 1 tablet (200 mg total) by mouth 3 (three) times daily. Patient not taking: Reported on 09/06/2018 06/19/18   Homero Fellers, MD  Levonorgestrel (LILETTA, 52 MG,) 19.5 MCG/DAY IUD IUD 1 Intra Uterine Device (1 each total) by Intrauterine route once for 1 dose. 07/27/18 07/27/18  Will Bonnet, MD  omeprazole (PRILOSEC) 40 MG capsule Take 1 capsule (40 mg total) by mouth daily. Patient not taking: Reported on 09/06/2018 05/06/18   Rexene Agent, CNM  Prenatal Vit-Fe Fumarate-FA (MULTIVITAMIN-PRENATAL) 27-0.8 MG TABS tablet Take 1 tablet by mouth daily at 12 noon.    [provider]    Allergies Penicillins and Latex  Family History  Problem Relation Age of Onset   Ovarian cancer Maternal Aunt     Social History Social History   Tobacco Use   Smoking status: Never Smoker   Smokeless tobacco: Never Used  Substance Use Topics   Alcohol use: No    Frequency: Never   Drug use: No    Review of Systems  Constitutional: Negative for fever. Eyes: Negative for visual changes. ENT: Negative for sore throat. Neck: No neck pain  Cardiovascular: Negative for chest pain. Respiratory: Negative for shortness of breath. Gastrointestinal: + abdominal pain, vomiting and diarrhea.  Genitourinary: Negative for dysuria. + frequency Musculoskeletal: Negative for back pain. Skin: Negative for rash. Neurological: Negative for headaches, weakness or numbness. Psych: No SI or HI  ____________________________________________   PHYSICAL EXAM:  VITAL SIGNS: ED Triage Vitals  Enc Vitals Group     BP 05/22/19 2245 (!) 145/73     Pulse Rate 05/22/19 2245 (!) 118     Resp 05/22/19 2245 18     Temp 05/22/19 2245 98.4 F (36.9 C)     Temp Source 05/22/19 2245  Oral     SpO2 05/22/19 2245 100 %     Weight 05/22/19 2243 215 lb (97.5 kg)     Height 05/22/19 2243 5' 8"  (1.727 m)     Head Circumference --      Peak Flow --      Pain Score 05/22/19 2243 8     Pain Loc --      Pain Edu? --      Excl. in [place]? --     Constitutional: Alert and oriented.  Looks uncomfortable but in no distress  HEENT:      Head: Normocephalic and atraumatic.         Eyes: Conjunctivae are normal. Sclera is non-icteric.       Mouth/Throat: Mucous membranes are moist.       Neck: Supple with no signs of meningismus. Cardiovascular: Tachycardic with regular rate and rhythm. No murmurs, gallops, or rubs. 2+ symmetrical distal pulses are present in all extremities. Respiratory: Normal respiratory effort. Lungs are clear to auscultation bilaterally. No wheezes, crackles, or rhonchi.  Gastrointestinal: Soft, tender to palpation on the right quadrants worse on the RUQ with positive Murphy's sign, and non distended with positive bowel sounds. No rebound or guarding. Genitourinary: No CVA tenderness. Musculoskeletal: Nontender with normal range of motion in all extremities. No edema, cyanosis, or erythema of extremities. Neurologic: Normal speech and language. Face is symmetric. Moving all extremities. No gross focal neurologic deficits are appreciated. Skin: Skin is warm, dry and intact. No rash noted. Psychiatric: Mood and affect are normal. Speech and behavior are normal.  ____________________________________________   LABS (all labs ordered are listed, but only abnormal results are displayed)  Labs Reviewed  COMPREHENSIVE METABOLIC PANEL - Abnormal; Notable for the following components:      Result Value   Glucose, Bld 100 (*)    All other components within normal limits  CBC - Abnormal; Notable for the following components:   WBC 12.8 (*)    All other components within normal limits  URINALYSIS, COMPLETE (UACMP) WITH MICROSCOPIC - Abnormal; Notable for the following  components:   Color, Urine YELLOW (*)    APPearance CLOUDY (*)    Hgb urine dipstick MODERATE (*)    Protein, ur 30 (*)    Leukocytes,Ua TRACE (*)    All other components within normal limits  URINE CULTURE  SARS CORONAVIRUS 2 (HOSPITAL ORDER, PERFORMED IN [facility])  LIPASE, BLOOD  HCG, QUANTITATIVE, PREGNANCY  POC URINE PREG, ED  POCT PREGNANCY, URINE   ____________________________________________  EKG  none  ____________________________________________  RADIOLOGY  I have personally reviewed the images performed during this visit and I agree with the Radiologist's read.   Interpretation by Radiologist:  Ct Abdomen Pelvis W Contrast  Result Date: 05/23/2019 CLINICAL DATA:  Abdominal pain for 1 week EXAM: CT ABDOMEN AND PELVIS WITH CONTRAST TECHNIQUE: Multidetector CT imaging of the abdomen and pelvis was performed using the standard protocol following bolus administration  of intravenous contrast. CONTRAST:  166m OMNIPAQUE IOHEXOL 300 MG/ML  SOLN COMPARISON:  Ultrasound from earlier in the same day. FINDINGS: Lower chest: No acute abnormality. Hepatobiliary: Gallbladder is within normal limits. The known gallstones are not well appreciated on this exam. Liver is within normal limits. Minimal perihepatic fluid is noted. Pancreas: Unremarkable. No pancreatic ductal dilatation or surrounding inflammatory changes. Spleen: Normal in size without focal abnormality. Adrenals/Urinary Tract: Adrenal glands are within normal limits. Kidneys are well visualized bilaterally within normal enhancement pattern. Tiny nonobstructing left renal stone is noted. Bladder is partially distended. Stomach/Bowel: Colon is decompressed without inflammatory change. The appendix is within normal limits. Stomach is within normal limits. The distal jejunum and proximal and mid ileal dilatation is seen with a transition zone in the right lower quadrant without focal mass. Multiple hyperdense areas are  identified throughout the dilated small bowel likely related to ingested content. Vascular/Lymphatic: No significant vascular findings are present. No enlarged abdominal or pelvic lymph nodes. Reproductive: Uterus and bilateral adnexa are unremarkable. An IUD is noted in place. Other: Mild free fluid is noted within the pelvis. This may be physiologic in nature but may be related to the dilated small bowel in a reactive manner. Musculoskeletal: No acute or significant osseous findings. IMPRESSION: Multiple dilated loops of small bowel without definitive obstructive lesion. A transition zone is noted in the right mid abdomen. This is consistent with a mild partial small bowel obstruction. No other focal abnormality is noted. Electronically Signed   By: MInez CatalinaM.D.   On: 05/23/2019 01:49   UKoreaAbdomen Limited Ruq  Result Date: 05/23/2019 CLINICAL DATA:  Right upper quadrant pain EXAM: ULTRASOUND ABDOMEN LIMITED RIGHT UPPER QUADRANT COMPARISON:  11/20/2015 FINDINGS: Gallbladder: Gallbladder is well distended with evidence of cholelithiasis. No wall thickening or pericholecystic fluid is noted. Common bile duct: Diameter: 7.2 mm. No definitive stone is seen although this is greater than that expected for the patient's age. Correlation with laboratory values is recommended. Liver: No focal lesion identified. Within normal limits in parenchymal echogenicity. Portal vein is patent on color Doppler imaging with normal direction of blood flow towards the liver. Other: None. IMPRESSION: Cholelithiasis. Mild CBD prominence without definitive stone. Correlation with laboratory values is recommended. Electronically Signed   By: MInez CatalinaM.D.   On: 05/23/2019 01:06      ____________________________________________   PROCEDURES  Procedure(s) performed: None Procedures Critical Care performed:  None ____________________________________________   INITIAL IMPRESSION / ASSESSMENT AND PLAN / ED COURSE  33 y.o. female with history of Crohn's disease not on any treatment, migraine headaches, and anxiety who presents for evaluation of abdominal pain.  Ddx cholecystitis, cholelithiasis, choledocholithiasis, pancreatitis, gastritis, peptic ulcer disease, Crohn's flare, kidney stone, pyelonephritis, gastroenteritis.  Patient looks uncomfortable but in no distress, she is tachycardic, afebrile, abdomen is soft with positive bowel sounds, diffusely tender on the right quadrants but worse in the right upper quadrant positive Murphy sign.  Labs show leukocytosis with white count of 12.2.  UA with moderate hemoglobin but no nitrites or bacteria.  Patient is currently not on her menstrual period.  Pregnancy test negative.  CMP and lipase are within normal limits.  Will treat symptoms with fentanyl, Toradol, Zofran, IV fluids.  We will send patient for right upper quadrant ultrasound.    _________________________ 2:13 AM on 05/23/2019 -----------------------------------------  UKoreapositive for cholelithiasis with no evidence of cholecystitis.  CT scan consistent with partial small bowel obstruction.  Discussed with Dr. PDahlia Byesfrom  surgery who recommended admission with the hospitalist service for medical management and GI consultation.  He will follow patient as a Optometrist.  Recommended against NG tube since patient's abdomen is not distended, she is passing flatus and having bowel movements.  Will discuss with the hospitalist for admission.    As part of my medical decision making, I reviewed the following data within the [place] notes reviewed and incorporated, Labs reviewed , Old chart reviewed, Radiograph reviewed , Discussed with admitting physician , A consult was requested and obtained from this/these consultant(s) Surgery, Notes from prior ED visits and [place] Controlled Substance Database   Patient was evaluated in Emergency Department today for the symptoms described in the  history of present illness. Patient was evaluated in the context of the global COVID-19 pandemic, which necessitated consideration that the patient might be at risk for infection with the SARS-CoV-2 virus that causes COVID-19. Institutional protocols and algorithms that pertain to the evaluation of patients at risk for COVID-19 are in a state of rapid change based on information released by regulatory bodies including the CDC and federal and state organizations. These policies and algorithms were followed during the patient's care in the ED.   ____________________________________________   FINAL CLINICAL IMPRESSION(S) / ED DIAGNOSES   Final diagnoses:  RUQ abdominal pain  Partial small bowel obstruction (HCC)  Calculus of gallbladder without cholecystitis without obstruction      NEW MEDICATIONS STARTED DURING THIS VISIT:  ED Discharge Orders    None       Note:  This document was prepared using Dragon voice recognition software and may include unintentional dictation errors.    Alfred Levins, [place], MD 05/23/19 [phone]

## 2019-05-23 NOTE — ED Notes (Signed)
ED TO INPATIENT HANDOFF REPORT  ED Nurse Name and Phone #: Lurena JoinerRebecca [phone]  S Name/Age/Gender Angela Braun 33 y.o. female Room/Bed: ED10A/ED10A  Code Status   Code Status: Full Code  Home/SNF/Other Home Patient oriented to: self, place, time and situation Is this baseline? Yes   Triage Complete: Triage complete  Chief Complaint Abd Pain  Triage Note Abdominal pain for 1 week with nausea.   Allergies Allergies  Allergen Reactions  . Penicillins Hives  . Latex Rash    Level of Care/Admitting Diagnosis ED Disposition    ED Disposition Condition Comment   Admit  Hospital Area: [facility] [[id number]]  Level of Care: Med-Surg [16]  Covid Evaluation: Asymptomatic Screening Protocol (No Symptoms)  Diagnosis: Partial small bowel obstruction [facility](HCC) [[id number]]) [[id number]]  Admitting Physician: Hannah BeatMANSY, JAN A [[id number]]  Attending Physician: Hannah BeatMANSY, JAN A [[id number]]  Estimated length of stay: past midnight tomorrow  Certification:: I certify this patient will need inpatient services for at least 2 midnights  PT Class (Do Not Modify): Inpatient [101]  PT Acc Code (Do Not Modify): Private [1]       B Medical/Surgery History Past Medical History:  Diagnosis Date  . Abnormal Pap smear of cervix   . Crohn disease (HCC)   . Migraine   . Mitral valve prolapse    Past Surgical History:  Procedure Laterality Date  . CESAREAN SECTION N/A 06/16/2018   Procedure: CESAREAN SECTION;  Surgeon: Conard NovakJackson, Stephen D, MD;  Location: [facility] ORS;  Service: Obstetrics;  Laterality: N/A;  . COLONOSCOPY       A IV Location/Drains/Wounds Patient Lines/Drains/Airways Status   Active Line/Drains/Airways    Name:   Placement date:   Placement time:   Site:   Days:   Peripheral IV 05/23/19 Right Antecubital   05/23/19    0031    Antecubital   less than 1   Pain Pump Midline Abdomen   06/16/18    2050     341   Incision (Closed) 06/16/18 Abdomen   06/16/18    2140     341           Intake/Output Last 24 hours No intake or output data in the 24 hours ending 05/23/19 0327  Labs/Imaging Results for orders placed or performed during the hospital encounter of 05/23/19 (from the past 48 hour(s))  Lipase, blood     Status: None   Collection Time: 05/22/19 10:52 PM  Result Value Ref [place]   Lipase 21 11 - 51 U/L    Comment: Performed at [facility], [address] [place], [place] [address]  Comprehensive metabolic panel     Status: Abnormal   Collection Time: 05/22/19 10:52 PM  Result Value Ref Range   Sodium 137 135 - 145 mmol/L   Potassium 3.7 3.5 - 5.1 mmol/L   Chloride 102 98 - 111 mmol/L   CO2 27 22 - 32 mmol/L   Glucose, Bld 100 (H) 70 - 99 mg/dL   BUN 14 6 - 20 mg/dL   Creatinine, Ser 1.300.89 0.44 - 1.00 mg/dL   Calcium 9.2 8.9 - 86.510.3 mg/dL   Total Protein 7.1 6.5 - 8.1 g/dL   Albumin 4.2 3.5 - 5.0 g/dL   AST 15 15 - 41 U/L   ALT 15 0 - 44 U/L   Alkaline Phosphatase 39 38 - 126 U/L   Total Bilirubin 0.7 0.3 - 1.2 mg/dL   GFR calc non Af Amer >60 >60 mL/min  GFR calc Af Amer >60 >60 mL/min   Anion gap 8 5 - 15    Comment: Performed at [facility], [address] [place], [place] [address]  CBC     Status: Abnormal   Collection Time: 05/22/19 10:52 PM  Result Value Ref Range   WBC 12.8 (H) 4.0 - 10.5 K/uL   RBC 4.71 3.87 - 5.11 MIL/uL   Hemoglobin 14.8 12.0 - 15.0 g/dL   HCT 43.2 36.0 - 46.0 %   MCV 91.7 80.0 - 100.0 fL   MCH 31.4 26.0 - 34.0 pg   MCHC 34.3 30.0 - 36.0 g/dL   RDW 11.9 11.5 - 15.5 %   Platelets 251 150 - 400 K/uL   nRBC 0.0 0.0 - 0.2 %    Comment: Performed at [facility], [address] [place], [place] [address]  hCG, quantitative, pregnancy     Status: None   Collection Time: 05/22/19 10:52 PM  Result Value Ref Range   hCG, Beta Chain, Quant, S <1 <5 mIU/mL    Comment:          GEST. AGE      CONC.  (mIU/mL)   <=1 WEEK        5 - 50     2 WEEKS       50 - 500     3 WEEKS       100 -  10,000     4 WEEKS     1,000 - 30,000     5 WEEKS     3,500 - 115,000   6-8 WEEKS     12,000 - 270,000    12 WEEKS     15,000 - 220,000        FEMALE AND NON-PREGNANT FEMALE:     LESS THAN 5 mIU/mL Performed at [facility], [address] [place], [place] [address]   Urinalysis, Complete w Microscopic     Status: Abnormal   Collection Time: 05/22/19 10:53 PM  Result Value Ref Range   Color, Urine YELLOW (A) YELLOW   APPearance CLOUDY (A) CLEAR   Specific Gravity, Urine 1.027 1.005 - 1.030   pH 5.0 5.0 - 8.0   Glucose, UA NEGATIVE NEGATIVE mg/dL   Hgb urine dipstick MODERATE (A) NEGATIVE   Bilirubin Urine NEGATIVE NEGATIVE   Ketones, ur NEGATIVE NEGATIVE mg/dL   Protein, ur 30 (A) NEGATIVE mg/dL   Nitrite NEGATIVE NEGATIVE   Leukocytes,Ua TRACE (A) NEGATIVE   RBC / HPF 6-10 0 - 5 RBC/hpf   WBC, UA 11-20 0 - 5 WBC/hpf   Bacteria, UA NONE SEEN NONE SEEN   Squamous Epithelial / LPF 11-20 0 - 5   Mucus PRESENT     Comment: Performed at [facility], [address] [place], [place] [address]  Pregnancy, urine POC     Status: None   Collection Time: 05/22/19 11:05 PM  Result Value Ref Range   Preg Test, Ur NEGATIVE NEGATIVE    Comment:        THE SENSITIVITY OF THIS METHODOLOGY IS >24 mIU/mL    Ct Abdomen Pelvis W Contrast  Result Date: 05/23/2019 CLINICAL DATA:  Abdominal pain for 1 week EXAM: CT ABDOMEN AND PELVIS WITH CONTRAST TECHNIQUE: Multidetector CT imaging of the abdomen and pelvis was performed using the standard protocol following bolus administration of intravenous contrast. CONTRAST:  126mL OMNIPAQUE IOHEXOL 300 MG/ML  SOLN COMPARISON:  Ultrasound from earlier in the same day.  FINDINGS: Lower chest: No acute abnormality. Hepatobiliary: Gallbladder is within normal limits. The known gallstones are not well appreciated on this exam. Liver is within normal limits. Minimal perihepatic fluid is noted. Pancreas: Unremarkable. No pancreatic ductal  dilatation or surrounding inflammatory changes. Spleen: Normal in size without focal abnormality. Adrenals/Urinary Tract: Adrenal glands are within normal limits. Kidneys are well visualized bilaterally within normal enhancement pattern. Tiny nonobstructing left renal stone is noted. Bladder is partially distended. Stomach/Bowel: Colon is decompressed without inflammatory change. The appendix is within normal limits. Stomach is within normal limits. The distal jejunum and proximal and mid ileal dilatation is seen with a transition zone in the right lower quadrant without focal mass. Multiple hyperdense areas are identified throughout the dilated small bowel likely related to ingested content. Vascular/Lymphatic: No significant vascular findings are present. No enlarged abdominal or pelvic lymph nodes. Reproductive: Uterus and bilateral adnexa are unremarkable. An IUD is noted in place. Other: Mild free fluid is noted within the pelvis. This may be physiologic in nature but may be related to the dilated small bowel in a reactive manner. Musculoskeletal: No acute or significant osseous findings. IMPRESSION: Multiple dilated loops of small bowel without definitive obstructive lesion. A transition zone is noted in the right mid abdomen. This is consistent with a mild partial small bowel obstruction. No other focal abnormality is noted. Electronically Signed   By: Alcide Clever M.D.   On: 05/23/2019 01:49   US Abdomen Limited Ruq  Result Date: 05/23/2019 CLINICAL DATA:  Right upper quadrant pain EXAM: ULTRASOUND ABDOMEN LIMITED RIGHT UPPER QUADRANT COMPARISON:  11/20/2015 FINDINGS: Gallbladder: Gallbladder is well distended with evidence of cholelithiasis. No wall thickening or pericholecystic fluid is noted. Common bile duct: Diameter: 7.2 mm. No definitive stone is seen although this is greater than that expected for the patient's age. Correlation with laboratory values is recommended. Liver: No focal lesion  identified. Within normal limits in parenchymal echogenicity. Portal vein is patent on color Doppler imaging with normal direction of blood flow towards the liver. Other: None. IMPRESSION: Cholelithiasis. Mild CBD prominence without definitive stone. Correlation with laboratory values is recommended. Electronically Signed   By: Alcide Clever M.D.   On: 05/23/2019 01:06    Pending Labs Unresulted Labs (From admission, onward)    Start     Ordered   05/30/19 0500  Creatinine, serum  (enoxaparin (LOVENOX)    CrCl >/= 30 ml/min)  Weekly,   STAT    Comments: while on enoxaparin therapy    05/23/19 0243   05/23/19 0500  Basic metabolic panel  Daily,   STAT     05/23/19 0243   05/23/19 0500  CBC  Daily,   STAT     05/23/19 0243   05/23/19 0242  Gastrointestinal Panel by PCR , Stool  (Gastrointestinal Panel by PCR, Stool                                                                                                                                                     *  Does Not include CLOSTRIDIUM DIFFICILE testing.**If CDIFF testing is needed, select the C Difficile Quick Screen w PCR reflex order below)  Once,   STAT     05/23/19 0243   05/23/19 0242  C difficile quick scan w PCR reflex  (C Difficile quick screen w PCR reflex panel)  Once, for 24 hours,   STAT     05/23/19 0243   05/23/19 0237  HIV antibody (Routine Testing)  Once,   STAT     05/23/19 0243   05/23/19 0216  SARS Coronavirus 2 [facility](Hospital order, Performed in [facility] hospital lab) Nasopharyngeal Nasopharyngeal Swab  (Symptomatic/High Risk of Exposure/Tier 1 Patients Labs with Precautions)  Once,   STAT    Question Answer Comment  Is this test for diagnosis or screening Diagnosis of ill patient   Symptomatic for COVID-19 as defined by CDC No   Hospitalized for COVID-19 No   Admitted to ICU for COVID-19 No   Previously tested for COVID-19 No   Resident in a congregate (group) care setting No   Employed in healthcare setting No    Pregnant No      05/23/19 0215   05/23/19 0113  Urine Culture  Add-on,   AD     05/23/19 0112          Vitals/Pain Today's Vitals   05/22/19 2243 05/22/19 2245 05/23/19 0124 05/23/19 0317  BP:  (!) 145/73  100/64  Pulse:  (!) 118  92  Resp:  18  17  Temp:  98.4 F (36.9 C)    TempSrc:  Oral    SpO2:  100%  100%  Weight: 97.5 kg     Height: 5\' 8"  (1.727 m)     PainSc: 8   2      Isolation Precautions Enteric precautions (UV disinfection)  Medications Medications  labetalol (NORMODYNE) tablet 200 mg (has no administration in time range)  FLUoxetine (PROZAC) tablet 10 mg (has no administration in time range)  levonorgestrel (LILETTA) 19.5 MCG/DAY IUD 1 each (has no administration in time range)  pantoprazole (PROTONIX) EC tablet 40 mg (has no administration in time range)  ferrous sulfate tablet 325 mg (has no administration in time range)  multivitamin with minerals tablet 1 tablet (has no administration in time range)  enoxaparin (LOVENOX) injection 40 mg (has no administration in time range)  0.9 %  sodium chloride infusion (has no administration in time range)  acetaminophen (TYLENOL) tablet 650 mg (650 mg Oral Given 05/23/19 0317)    Or  acetaminophen (TYLENOL) suppository 650 mg ( Rectal See Alternative 05/23/19 0317)  oxyCODONE (Oxy IR/ROXICODONE) immediate release tablet 5 mg (has no administration in time range)  ketorolac (TORADOL) 30 MG/ML injection 30 mg (has no administration in time range)  traZODone (DESYREL) tablet 25 mg (has no administration in time range)  ondansetron (ZOFRAN) tablet 4 mg ( Oral See Alternative 05/23/19 0315)    Or  ondansetron (ZOFRAN) injection 4 mg (4 mg Intravenous Given 05/23/19 0315)  cefTRIAXone (ROCEPHIN) 1 g in sodium chloride 0.9 % 100 mL IVPB (has no administration in time range)  sodium chloride 0.9 % bolus 1,000 mL (0 mLs Intravenous Stopped 05/23/19 0313)  ondansetron (ZOFRAN) injection 4 mg (4 mg Intravenous Given 05/23/19  0032)  ketorolac (TORADOL) 30 MG/ML injection 15 mg (15 mg Intravenous Given 05/23/19 0033)  fentaNYL (SUBLIMAZE) injection 50 mcg (50 mcg Intravenous Given 05/23/19 0033)  iohexol (OMNIPAQUE) 300 MG/ML solution 100 mL (100 mLs Intravenous Contrast Given 05/23/19  0126)  0.9 %  sodium chloride infusion ( Intravenous New Bag/Given 05/23/19 0314)  morphine 4 MG/ML injection 4 mg (4 mg Intravenous Given 05/23/19 0316)    Mobility walks Low fall risk   Focused Assessments small bowel obstruction   R Recommendations: See Admitting Provider Note  Report given to:   Additional Notes:

## 2019-05-23 NOTE — Consult Note (Addendum)
[facility] SURGICAL CONSULTATION NOTE (initial) - cpt: 21194   HISTORY OF PRESENT ILLNESS (HPI):  33 y.o. female presented to [facility] ED overnight for evaluation of abdominal pain. Patient reported intermittent abdominal pain x1 year, but over the last week the pain has worsened and become constant for the last 24 hours. The pain is described as sharp and in the RUQ which radiates throughout the abdomen. Worse with eating. Nothing seems to improve the pain in the last 24 hours. She endorses associated nausea, intermittent non-bloody, non-bilious emesis, and intermittent diarrhea. No fever, chills, cough, CP, SOB, or urinary changes. Of note, she was diagnosed with Crohn's disease at 31 years ol but is not on any treatment for this. Only prior abdominal surgery is C-Section. Work up in the ED was concerning for mild leukocytosis and partial small bowel obstruction on CT. She was admitted to medicine with general surgery and GI consultations.   Surgery is consulted by emergency medicine physician Dr. Rudene Re, MD in this context for evaluation and management of partial small bowel obstruction.   PAST MEDICAL HISTORY (PMH):  Past Medical History:  Diagnosis Date  . Abnormal Pap smear of cervix   . Crohn disease ([place])   . Migraine   . Mitral valve prolapse      PAST SURGICAL HISTORY ([place]):  Past Surgical History:  Procedure Laterality Date  . CESAREAN SECTION N/A 06/16/2018   Procedure: CESAREAN SECTION;  Surgeon: Will Bonnet, MD;  Location: [facility] ORS;  Service: Obstetrics;  Laterality: N/A;  . COLONOSCOPY       MEDICATIONS:  Prior to Admission medications   Medication Sig Start Date End Date Taking? Authorizing Provider  FLUoxetine (PROZAC) 10 MG tablet Take 1 tablet (10 mg total) by mouth daily. 04/13/19  Yes Will Bonnet, MD  ferrous sulfate 325 (65 FE) MG tablet Take 1 tablet (325 mg total) by mouth 2 (two) times daily with a meal. Patient not taking:  Reported on 09/06/2018 06/19/18   Homero Fellers, MD  labetalol (NORMODYNE) 200 MG tablet Take 1 tablet (200 mg total) by mouth 3 (three) times daily. Patient not taking: Reported on 09/06/2018 06/19/18   Homero Fellers, MD  Levonorgestrel (LILETTA, 52 MG,) 19.5 MCG/DAY IUD IUD 1 Intra Uterine Device (1 each total) by Intrauterine route once for 1 dose. 07/27/18 07/27/18  Will Bonnet, MD  omeprazole (PRILOSEC) 40 MG capsule Take 1 capsule (40 mg total) by mouth daily. Patient not taking: Reported on 09/06/2018 05/06/18   Rexene Agent, CNM  Prenatal Vit-Fe Fumarate-FA (MULTIVITAMIN-PRENATAL) 27-0.8 MG TABS tablet Take 1 tablet by mouth daily at 12 noon.    [provider]     ALLERGIES:  Allergies  Allergen Reactions  . Penicillins Hives  . Latex Rash     SOCIAL HISTORY:  Social History   Socioeconomic History  . Marital status: Married    Spouse name: Not on file  . Number of children: Not on file  . Years of education: Not on file  . Highest education level: Not on file  Occupational History  . Not on file  Social Needs  . Financial resource strain: Not hard at all  . Food insecurity    Worry: Never true    Inability: Never true  . Transportation needs    Medical: No    Non-medical: No  Tobacco Use  . Smoking status: Never Smoker  . Smokeless tobacco: Never Used  Substance and Sexual Activity  .  Alcohol use: No    Frequency: Never  . Drug use: No  . Sexual activity: Yes    Birth control/protection: Implant  Lifestyle  . Physical activity    Days per week: 0 days    Minutes per session: Not on file  . Stress: Not at all  Relationships  . Social connections    Talks on phone: More than three times a week    Gets together: Twice a week    Attends religious service: 1 to 4 times per year    Active member of club or organization: No    Attends meetings of clubs or organizations: Never    Relationship status: Married  . Intimate  partner violence    Fear of current or ex partner: No    Emotionally abused: No    Physically abused: No    Forced sexual activity: No  Other Topics Concern  . Not on file  Social History Narrative  . Not on file     FAMILY HISTORY:  Family History  Problem Relation Age of Onset  . Ovarian cancer Maternal Aunt       REVIEW OF SYSTEMS:  Review of Systems  Constitutional: Negative for chills, fever and malaise/fatigue.  HENT: Negative for congestion and sore throat.   Respiratory: Negative for cough and shortness of breath.   Cardiovascular: Negative for chest pain and palpitations.  Gastrointestinal: Positive for abdominal pain, diarrhea, nausea and vomiting. Negative for blood in stool and constipation.  Genitourinary: Negative for dysuria and urgency.  Neurological: Negative for dizziness and headaches.  All other systems reviewed and are negative.   VITAL SIGNS:  Temp:  [97.7 F (36.5 C)-98.4 F (36.9 C)] 97.7 F (36.5 C) (09/14 0509) Pulse Rate:  [66-118] 88 (09/14 0509) Resp:  [16-18] 18 (09/14 0509) BP: (99-145)/(64-89) 99/65 (09/14 0509) SpO2:  [98 %-100 %] 100 % (09/14 0509) Weight:  [94.5 kg-97.5 kg] 94.5 kg (09/14 0509)     Height: 5' 7"  (170.2 cm) Weight: 94.5 kg BMI (Calculated): 32.62   INTAKE/OUTPUT:  No intake/output data recorded.  PHYSICAL EXAM:  Physical Exam Constitutional:      General: She is not in acute distress.    Appearance: She is well-developed. She is obese. She is not ill-appearing.  HENT:     Head: Normocephalic and atraumatic.  Eyes:     General: No scleral icterus.    Extraocular Movements: Extraocular movements intact.  Cardiovascular:     Rate and Rhythm: Normal rate and regular rhythm.     Heart sounds: Normal heart sounds. No murmur. No friction rub. No gallop.   Pulmonary:     Effort: Pulmonary effort is normal. No respiratory distress.     Breath sounds: Normal breath sounds. No wheezing.  Abdominal:     General:  Abdomen is protuberant. A surgical scar is present. There is no distension.     Palpations: Abdomen is soft.     Tenderness: There is no abdominal tenderness. There is no guarding or rebound.     Comments: Previous C-Section scar  Genitourinary:    Comments: Deferred Skin:    General: Skin is warm and dry.     Coloration: Skin is not jaundiced or pale.  Neurological:     General: No focal deficit present.     Mental Status: She is alert and oriented to person, place, and time.  Psychiatric:        Mood and Affect: Mood normal.  Behavior: Behavior normal.      Labs:  CBC Latest Ref Rng & Units 05/23/2019 05/22/2019 06/18/2018  WBC 4.0 - 10.5 K/uL 10.0 12.8(H) 8.6  Hemoglobin 12.0 - 15.0 g/dL 12.3 14.8 10.2(L)  Hematocrit 36.0 - 46.0 % 35.8(L) 43.2 29.7(L)  Platelets 150 - 400 K/uL 208 251 185   CMP Latest Ref Rng & Units 05/23/2019 05/22/2019 06/16/2018  Glucose 70 - 99 mg/dL 107(H) 100(H) 79  BUN 6 - 20 mg/dL 13 14 10   Creatinine 0.44 - 1.00 mg/dL 0.84 0.89 0.77  Sodium 135 - 145 mmol/L 139 137 135  Potassium 3.5 - 5.1 mmol/L 4.2 3.7 4.1  Chloride 98 - 111 mmol/L 109 102 105  CO2 22 - 32 mmol/L 25 27 21(L)  Calcium 8.9 - 10.3 mg/dL 7.9(L) 9.2 8.9  Total Protein 6.5 - 8.1 g/dL - 7.1 6.3(L)  Total Bilirubin 0.3 - 1.2 mg/dL - 0.7 0.4  Alkaline Phos 38 - 126 U/L - 39 73  AST 15 - 41 U/L - 15 25  ALT 0 - 44 U/L - 15 17     Imaging studies:   CT Abdomen/Pelvis (05/23/2019) personally reviewed showing dilated loops of small bowel and possible transition point in right abdomen, and radiologist report reviewed:  IMPRESSION: Multiple dilated loops of small bowel without definitive obstructive lesion. A transition zone is noted in the right mid abdomen. This is consistent with a mild partial small bowel obstruction.  No other focal abnormality is noted.   RUQ [place] (05/23/2019) personally reviewed showing cholelithiasis without cholecystis, and radiologist report reviewed  below:  IMPRESSION: Cholelithiasis. Mild CBD prominence without definitive stone. Correlation with laboratory values is recommended.    Assessment/Plan: (ICD-10's: K25.600) 33 y.o. female with now improving abdominal pain, nausea, and emesis and dilated small bowel with possible transition point in right abdomen concerning for partial SBO, although having diarrhea, which may be related to Crohn's history/flare up -vs- adhesive disease which is less likely as only previous surgery is c-section.    - Admit to medicine; GI consultation for Crohn's history   - NPO + IVF  - We can hold off on NGT decompression as pain/symptoms improving.  - Pain control prn (minimize narcotics); antiemetics prn  - monitor abdominal examination; on-going bowel function  - no indication for surgical intervention at this time  - further management per primary service     All of the above findings and recommendations were discussed with the patient, and all of patient's questions were answered to her expressed satisfaction.  Thank you for the opportunity to participate in this patient's care.   -- Edison Simon, PA-C [facility] 05/23/2019, 7:12 AM [phone] M-F: 7am - 4pm

## 2019-05-24 LAB — COMPREHENSIVE METABOLIC PANEL
ALT: 12 U/L (ref 0–44)
AST: 12 U/L — ABNORMAL LOW (ref 15–41)
Albumin: 3.1 g/dL — ABNORMAL LOW (ref 3.5–5.0)
Alkaline Phosphatase: 26 U/L — ABNORMAL LOW (ref 38–126)
Anion gap: 6 (ref 5–15)
BUN: 9 mg/dL (ref 6–20)
CO2: 23 mmol/L (ref 22–32)
Calcium: 7.7 mg/dL — ABNORMAL LOW (ref 8.9–10.3)
Chloride: 110 mmol/L (ref 98–111)
Creatinine, Ser: 0.77 mg/dL (ref 0.44–1.00)
GFR calc Af Amer: >60 mL/min (ref >60)
GFR calc non Af Amer: >60 mL/min (ref >60)
Glucose, Bld: 80 mg/dL (ref 70–99)
Potassium: 3.5 mmol/L (ref 3.5–5.1)
Sodium: 139 mmol/L (ref 135–145)
Total Bilirubin: 0.9 mg/dL (ref 0.3–1.2)
Total Protein: 5.2 g/dL — ABNORMAL LOW (ref 6.5–8.1)

## 2019-05-24 LAB — CBC
HCT: 34.7 % — ABNORMAL LOW (ref 36.0–46.0)
Hemoglobin: 11.7 g/dL — ABNORMAL LOW (ref 12.0–15.0)
MCH: 31.9 pg (ref 26.0–34.0)
MCHC: 33.7 g/dL (ref 30.0–36.0)
MCV: 94.6 fL (ref 80.0–100.0)
Platelets: 180 10*3/uL (ref 150–400)
RBC: 3.67 MIL/uL — ABNORMAL LOW (ref 3.87–5.11)
RDW: 11.9 % (ref 11.5–15.5)
WBC: 5.7 10*3/uL (ref 4.0–10.5)
nRBC: 0 % (ref 0.0–0.2)

## 2019-05-24 LAB — URINE CULTURE: Culture: <10000 — AB

## 2019-05-24 LAB — HIV ANTIBODY (ROUTINE TESTING W REFLEX): HIV Screen 4th Generation wRfx: NONREACTIVE

## 2019-05-24 NOTE — Discharge Instructions (Signed)
It was so nice to meet you during this hospitalization!  You came into the hospital with nausea, vomiting, and diarrhea. We think you had a partial bowel obstruction, but this resolved.  Please make sure you follow-up with the gastroenterologist (Dr. Vicente Males) in 1 week.  Take care, Dr. Brett Albino

## 2019-05-24 NOTE — Discharge Summary (Signed)
[employer] [facility] [place] [facility]   PATIENT NAME: Angela Braun    MR#:  [id number]  DATE OF BIRTH:  August 14, 1986  DATE OF ADMISSION:  05/23/2019   ADMITTING PHYSICIAN: Hannah BeatJan A Mansy, MD  DATE OF DISCHARGE: 05/24/19  PRIMARY CARE PHYSICIAN: Patient, No Pcp Per   ADMISSION DIAGNOSIS:  SBO (small bowel obstruction) (HCC) [K56.609] RUQ abdominal pain [R10.11] Partial small bowel obstruction (HCC) [K56.600] Calculus of gallbladder without cholecystitis without obstruction [K80.20] DISCHARGE DIAGNOSIS:  Active Problems:   Partial small bowel obstruction (HCC)  SECONDARY DIAGNOSIS:   Past Medical History:  Diagnosis Date  . Abnormal Pap smear of cervix   . Crohn disease (HCC)   . Migraine   . Mitral valve prolapse    HOSPITAL COURSE:   Angela Braun is a 33 year old female who presented to the ED with generalized abdominal pain, nausea, vomiting, and diarrhea.  In the ED, CT scan showed multiple dilated loops of small bowel, with a transition note in the right mid abdomen, consistent with a mild partial small bowel obstruction.  She was made n.p.o. and started on IV fluids.  She was admitted for further management.  Partial small bowel obstruction- resolved  Patient did have some bowel movements this admission. -Repeat abdominal x-ray did not show any evidence of a bowel obstruction -Seen by surgery, who recommended advancing diet as tolerated -Patient was able to tolerate soft diet on the day of discharge and her symptoms completely resolved  Crohn's disease- possible flare. -Seen by GI, who recommended CT enterography to rule out any strictures in the small bowel, which will be done as an outpatient -Patient will follow-up with GI in 1 week  Cholelithiasis- seen on right upper quadrant ultrasound.  Patient also has mild CBD prominence without definitive stone.  Total bili is normal. -GI recommended that patient have an MRCP as an outpatient to evaluate the common  bile duct, given an association between primary sclerosing cholangitis and inflammatory bowel disease  Possible UTI- initially placed on ceftriaxone, but patient was asymptomatic and not showing any signs of UTI. -Antibiotics were stopped -Urine culture with insignificant growth  Anxiety disorder- stable -Continued home Prozac  DISCHARGE CONDITIONS:  Crohn's disease Cholelithiasis Mild CBD prominence Anxiety CONSULTS OBTAINED:  Treatment Team:  Leafy RoPabon, Diego F, MD Wyline MoodAnna, Kiran, MD DRUG ALLERGIES:   Allergies  Allergen Reactions  . Penicillins Hives  . Latex Rash   DISCHARGE MEDICATIONS:   Allergies as of 05/24/2019      Reactions   Penicillins Hives   Latex Rash      Medication List    STOP taking these medications   ferrous sulfate 325 (65 FE) MG tablet   labetalol 200 MG tablet Commonly known as: NORMODYNE   omeprazole 40 MG capsule Commonly known as: PRILOSEC     TAKE these medications   FLUoxetine 10 MG tablet Commonly known as: PROZAC Take 1 tablet (10 mg total) by mouth daily.   levonorgestrel 19.5 MCG/DAY Iud IUD Commonly known as: Liletta (52 MG) 1 Intra Uterine Device (1 each total) by Intrauterine route once for 1 dose.   multivitamin-prenatal 27-0.8 MG Tabs tablet Take 1 tablet by mouth daily at 12 noon.        DISCHARGE INSTRUCTIONS:  1.  Follow-up with PCP in 5 days 2.  Follow-up with GI in 1 week DIET:  Regular diet DISCHARGE CONDITION:  Stable ACTIVITY:  Activity as tolerated OXYGEN:  Home Oxygen: No.  Oxygen Delivery: room air DISCHARGE  LOCATION:  home   If you experience worsening of your admission symptoms, develop shortness of breath, life threatening emergency, suicidal or homicidal thoughts you must seek medical attention immediately by calling 911 or calling your MD immediately  if symptoms less severe.  You Must read complete instructions/literature along with all the possible adverse reactions/side effects for all  the Medicines you take and that have been prescribed to you. Take any new Medicines after you have completely understood and accpet all the possible adverse reactions/side effects.   Please note  You were cared for by a hospitalist during your hospital stay. If you have any questions about your discharge medications or the care you received while you were in the hospital after you are discharged, you can call the unit and asked to speak with the hospitalist on call if the hospitalist that took care of you is not available. Once you are discharged, your primary care physician will handle any further medical issues. Please note that NO REFILLS for any discharge medications will be authorized once you are discharged, as it is imperative that you return to your primary care physician (or establish a relationship with a primary care physician if you do not have one) for your aftercare needs so that they can reassess your need for medications and monitor your lab values.    On the day of Discharge:  VITAL SIGNS:  Blood pressure 101/67, pulse 64, temperature 98.2 F (36.8 C), temperature source Oral, resp. rate 16, height 5\' 7"  (1.702 m), weight 94.5 kg, SpO2 98 %, not currently breastfeeding. PHYSICAL EXAMINATION:  GENERAL:  33 y.o.-year-old patient sitting up in the bed with no acute distress.  Well-appearing. EYES: Pupils equal, round, reactive to light and accommodation. No scleral icterus. Extraocular muscles intact.  HEENT: Head atraumatic, normocephalic. Oropharynx and nasopharynx clear.  NECK:  Supple, no jugular venous distention. No thyroid enlargement, no tenderness.  LUNGS: Normal breath sounds bilaterally, no wheezing, rales,rhonchi or crepitation. No use of accessory muscles of respiration.  CARDIOVASCULAR: S1, S2 normal. No murmurs, rubs, or gallops.  ABDOMEN: Soft, non-distended. Bowel sounds present. No organomegaly or mass. + Mild generalized tenderness to palpation, no rebound or  guarding. EXTREMITIES: No pedal edema, cyanosis, or clubbing.  NEUROLOGIC: Cranial nerves II through XII are intact. Muscle strength 5/5 in all extremities. Sensation intact. Gait not checked.  PSYCHIATRIC: The patient is alert and oriented x 3.  SKIN: No obvious rash, lesion, or ulcer.  DATA REVIEW:   CBC Recent Labs  Lab 05/24/19 0435  WBC 5.7  HGB 11.7*  HCT 34.7*  PLT 180    Chemistries  Recent Labs  Lab 05/24/19 0435  NA 139  K 3.5  CL 110  CO2 23  GLUCOSE 80  BUN 9  CREATININE 0.77  CALCIUM 7.7*  AST 12*  ALT 12  ALKPHOS 26*  BILITOT 0.9     Microbiology Results  Results for orders placed or performed during the hospital encounter of 05/23/19  Urine Culture     Status: Abnormal   Collection Time: 05/22/19 10:53 PM   Specimen: Urine, Random  Result Value Ref Range Status   Specimen Description   Final    URINE, RANDOM Performed at [facility], [address], [place], [place] [address]    Special Requests   Final    NONE Performed at [facility], [address], [place], [place] [address]    Culture (A)  Final    <10,000 COLONIES/mL INSIGNIFICANT GROWTH  Performed at [facility], [address] [place], [place] [address]    Report Status 05/24/2019 FINAL  Final  SARS Coronavirus 2 [facility](Hospital order, Performed in [facility] hospital lab) Nasopharyngeal Nasopharyngeal Swab     Status: None   Collection Time: 05/23/19  3:18 AM   Specimen: Nasopharyngeal Swab  Result Value Ref Range Status   SARS Coronavirus 2 NEGATIVE NEGATIVE Final    Comment: (NOTE) If result is NEGATIVE SARS-CoV-2 target nucleic acids are NOT DETECTED. The SARS-CoV-2 RNA is generally detectable in upper and lower  respiratory specimens during the acute phase of infection. The lowest  concentration of SARS-CoV-2 viral copies this assay can detect is 250  copies / mL. A negative result does not preclude SARS-CoV-2 infection  and should not be used as  the sole basis for treatment or other  patient management decisions.  A negative result may occur with  improper specimen collection / handling, submission of specimen other  than nasopharyngeal swab, presence of viral mutation(s) within the  areas targeted by this assay, and inadequate number of viral copies  (<250 copies / mL). A negative result must be combined with clinical  observations, patient history, and epidemiological information. If result is POSITIVE SARS-CoV-2 target nucleic acids are DETECTED. The SARS-CoV-2 RNA is generally detectable in upper and lower  respiratory specimens dur ing the acute phase of infection.  Positive  results are indicative of active infection with SARS-CoV-2.  Clinical  correlation with patient history and other diagnostic information is  necessary to determine patient infection status.  Positive results do  not rule out bacterial infection or co-infection with other viruses. If result is PRESUMPTIVE POSTIVE SARS-CoV-2 nucleic acids MAY BE PRESENT.   A presumptive positive result was obtained on the submitted specimen  and confirmed on repeat testing.  While 2019 novel coronavirus  (SARS-CoV-2) nucleic acids may be present in the submitted sample  additional confirmatory testing may be necessary for epidemiological  and / or clinical management purposes  to differentiate between  SARS-CoV-2 and other Sarbecovirus currently known to infect humans.  If clinically indicated additional testing with an alternate test  methodology [phone]([id number]) is advised. The SARS-CoV-2 RNA is generally  detectable in upper and lower respiratory sp ecimens during the acute  phase of infection. The expected result is Negative. Fact Sheet for Patients:  BoilerBrush.com.cyhttps://www.fda.gov/media/136312/download Fact Sheet for Healthcare Providers: https://pope.com/https://www.fda.gov/media/136313/download This test is not yet approved or cleared by the [place] FDA and has been authorized for  detection and/or diagnosis of SARS-CoV-2 by FDA under an Emergency Use Authorization (EUA).  This EUA will remain in effect (meaning this test can be used) for the duration of the COVID-19 declaration under Section 564(b)(1) of the Act, 21 U.S.C. section 360bbb-3(b)(1), unless the authorization is terminated or revoked sooner. Performed at [facility], [address] [place], [place] [address]     RADIOLOGY:  No results found.   Management plans discussed with the patient, family and they are in agreement.  CODE STATUS: Full Code   TOTAL TIME TAKING CARE OF THIS PATIENT: 45 minutes.    Jinny BlossomKaty D Qaadir Kent M.D on 05/24/2019 at 10:30 AM  Between 7am to 6pm - Pager - [phone]  After 6pm go to [email or web address] - Social research officer, governmentpassword EPAS [facility]  [facility]  Office  [phone]  CC: Primary care physician; Patient, No Pcp Per   Note: This dictation was prepared with Dragon dictation along with smaller phrase technology. Any transcriptional errors  that result from this process are unintentional.

## 2019-06-21 ENCOUNTER — Ambulatory Visit: Payer: Medicaid Other | Admitting: Gastroenterology

## 2019-07-08 ENCOUNTER — Other Ambulatory Visit: Payer: Self-pay | Admitting: Obstetrics and Gynecology

## 2019-07-08 DIAGNOSIS — F411 Generalized anxiety disorder: Secondary | ICD-10-CM

## 2019-07-20 NOTE — Telephone Encounter (Signed)
Can you give Angela Braun a call and let her know that in order to refill her medication (if she's still taking it) she needs to make an appointment.  If she is no longer taking the medication, she should still consider a routine annual gynecologic exam.  Once she has made an appointment (if she's taking the medication still) I'll refill the prescription.

## 2019-08-11 ENCOUNTER — Other Ambulatory Visit: Payer: Self-pay

## 2019-08-11 DIAGNOSIS — F411 Generalized anxiety disorder: Secondary | ICD-10-CM

## 2019-08-12 MED ORDER — FLUOXETINE HCL 10 MG PO TABS: 10.0000 mg | ORAL_TABLET | Freq: Every day | ORAL | 0 refills | Status: DC

## 2019-10-25 ENCOUNTER — Ambulatory Visit: Payer: Medicaid Other | Admitting: Gastroenterology

## 2019-10-25 ENCOUNTER — Other Ambulatory Visit: Payer: Self-pay

## 2019-10-25 VITALS — BP 115/76 | HR 79 | Temp 98.5°F | Ht 67.0 in | Wt 194.0 lb

## 2019-10-25 DIAGNOSIS — K5000 Crohn's disease of small intestine without complications: Secondary | ICD-10-CM

## 2019-10-25 NOTE — Progress Notes (Signed)
Angela Bellows MD, MRCP(U.K) [address]  [place]  [place], [place] [address]  Main: [phone]  Fax: [phone]   Primary Care Physician: Patient, No Pcp Per  Primary Gastroenterologist:  Dr. Harland German follow up   HPI: Angela Braun is a 34 y.o. female    Summary of history :   I was consulted to see her back in 05/2019 when she was admitted with abdominal pain and diarrhea.   In the emergency room she underwent a CT scan of the abdomen that demonstrated multiple dilated loops of the small bowel without definite obstructive lesion.  Transition zone is noted in the right mid abdomen consistent with mild partial small bowel obstruction.  Right upper quadrant ultrasound shows cholelithiasis and mild common bile duct prominence without definitive stone.  No prior encounters on Epic with  GI encounters.  I reviewed the same in care everywhere as well.  On admission hemoglobin 12.3 g with a platelet count of 208, creatinine 0.84.     She states that she has been diagnosed with Crohn's disease at the age of 67 affected the terminal ileum   She says that she took medications for about 2 to 3 years which involved steroids and other tablets and subsequently stopped.  She was doing fine till about 03/2019  when she started developing right-sided abdominal pain.  It was not associated with any other symptoms but yesterday she developed abdominal distention vomiting and worsening of the abdominal pain which brought her to the emergency room.      Interval history   05/2019-10/25/2019  Since hospital discharge she has had on and off crampy lower abdominal pains.  On and off diarrhea presently has none.  Denies any NSAID use.  Denies any rectal bleeding.  Current Outpatient Medications  Medication Sig Dispense Refill  . FLUoxetine (PROZAC) 10 MG tablet Take 1 tablet (10 mg total) by mouth daily. 90 tablet 0  . Levonorgestrel (LILETTA, 52 MG,) 19.5 MCG/DAY IUD IUD 1 Intra  Uterine Device (1 each total) by Intrauterine route once for 1 dose. 1 each 0  . Prenatal Vit-Fe Fumarate-FA (MULTIVITAMIN-PRENATAL) 27-0.8 MG TABS tablet Take 1 tablet by mouth daily at 12 noon.     No current facility-administered medications for this visit.    Allergies as of 10/25/2019 - Review Complete 05/23/2019  Allergen Reaction Noted  . Penicillins Hives 05/26/2014  . Latex Rash 06/16/2018    ROS:  General: Negative for anorexia, weight loss, fever, chills, fatigue, weakness. ENT: Negative for hoarseness, difficulty swallowing , nasal congestion. CV: Negative for chest pain, angina, palpitations, dyspnea on exertion, peripheral edema.  Respiratory: Negative for dyspnea at rest, dyspnea on exertion, cough, sputum, wheezing.  GI: See history of present illness. GU:  Negative for dysuria, hematuria, urinary incontinence, urinary frequency, nocturnal urination.  Endo: Negative for unusual weight change.    Physical Examination:   There were no vitals taken for this visit.  General: Well-nourished, well-developed in no acute distress.  Eyes: No icterus. Conjunctivae pink. Mouth: Oropharyngeal mucosa moist and pink , no lesions erythema or exudate. Lungs: Clear to auscultation bilaterally. Non-labored. Heart: Regular rate and rhythm, no murmurs rubs or gallops.  Abdomen: Bowel sounds are normal, nontender, nondistended, no hepatosplenomegaly or masses, no abdominal bruits or hernia , no rebound or guarding.   Extremities: No lower extremity edema. No clubbing or deformities. Neuro: Alert and oriented x 3.  Grossly intact. Skin: Warm and dry, no jaundice.  Psych: Alert and cooperative, normal mood and affect.   Imaging Studies: No results found.  Assessment and Plan:   Angela Braun is a 34 y.o. y/o female here for a hospital follow up . She has a  history of Crohn's of the small bowel diagnosed at age of 37.  She took treatment for 2 or 3 years.  Subsequently has not  followed up.  She seems to been doing reasonably well over the years.  Admitted in 05/2019 with  right-sided abdominal pain and on admission found to have a partial small bowel obstruction.  Treated conservatively.  My aim at this point of time is to determine if she has active Crohn's disease.  This I will establish by performing an MRI of her abdomen and also confirm endoscopically with biopsies.  Based on the results we will determine the next step including treatment.  I explained to her that important to follow-up and go through the treatment process since chronic inflammation can lead to strictures.    Plan 1.  MR enterography to evaluate small bowel  2.   CRP and fecal calprotectin.  Check stool for GI PCR and C. difficile if has diarrhea which she has none now  3. Colonoscopy with ileoscopy and biopsies.  4.  Hepatic function panel in view of dilated common bile duct seen on imaging previously .Marland Kitchen If abnormal may need MRCP.  There is an association with primary sclerosing cholangitis and inflammatory bowel disease.   I have discussed alternative options, risks & benefits,  which include, but are not limited to, bleeding, infection, perforation,respiratory complication & drug reaction.  The patient agrees with this plan & written consent will be obtained.     Dr Angela Bellows  MD,MRCP [facility]) Follow up in 6-8 weeks

## 2019-10-25 NOTE — Addendum Note (Signed)
Addended by: Dorethea Clan on: 10/25/2019 03:11 PM   Modules accepted: Orders

## 2019-10-26 NOTE — Telephone Encounter (Signed)
Liletta rcvd/charged 07/27/18

## 2019-10-28 LAB — COMPREHENSIVE METABOLIC PANEL
ALT: 14 IU/L (ref 0–32)
AST: 12 IU/L (ref 0–40)
Albumin/Globulin Ratio: 1.9 (ref 1.2–2.2)
Albumin: 4.5 g/dL (ref 3.8–4.8)
Alkaline Phosphatase: 54 IU/L (ref 39–117)
BUN/Creatinine Ratio: 18 (ref 9–23)
BUN: 17 mg/dL (ref 6–20)
Bilirubin Total: 0.3 mg/dL (ref 0.0–1.2)
CO2: 22 mmol/L (ref 20–29)
Calcium: 9.7 mg/dL (ref 8.7–10.2)
Chloride: 103 mmol/L (ref 96–106)
Creatinine, Ser: 0.93 mg/dL (ref 0.57–1.00)
GFR calc Af Amer: 93 mL/min/{1.73_m2} (ref >59)
GFR calc non Af Amer: 80 mL/min/{1.73_m2} (ref >59)
Globulin, Total: 2.4 g/dL (ref 1.5–4.5)
Glucose: 81 mg/dL (ref 65–99)
Potassium: 4.3 mmol/L (ref 3.5–5.2)
Sodium: 141 mmol/L (ref 134–144)
Total Protein: 6.9 g/dL (ref 6.0–8.5)

## 2019-10-28 LAB — CBC WITH DIFFERENTIAL/PLATELET
Basophils Absolute: 0 10*3/uL (ref 0.0–0.2)
Basos: 1 %
EOS (ABSOLUTE): 0.2 10*3/uL (ref 0.0–0.4)
Eos: 3 %
Hematocrit: 42.7 % (ref 34.0–46.6)
Hemoglobin: 14.2 g/dL (ref 11.1–15.9)
Immature Grans (Abs): 0 10*3/uL (ref 0.0–0.1)
Immature Granulocytes: 0 %
Lymphocytes Absolute: 1.8 10*3/uL (ref 0.7–3.1)
Lymphs: 27 %
MCH: 31.4 pg (ref 26.6–33.0)
MCHC: 33.3 g/dL (ref 31.5–35.7)
MCV: 95 fL (ref 79–97)
Monocytes Absolute: 0.4 10*3/uL (ref 0.1–0.9)
Monocytes: 6 %
Neutrophils Absolute: 4.2 10*3/uL (ref 1.4–7.0)
Neutrophils: 63 %
Platelets: 255 10*3/uL (ref 150–450)
RBC: 4.52 x10E6/uL (ref 3.77–5.28)
RDW: 12.2 % (ref 11.7–15.4)
WBC: 6.6 10*3/uL (ref 3.4–10.8)

## 2019-10-28 LAB — HEPATITIS A ANTIBODY, TOTAL: hep A Total Ab: NEGATIVE

## 2019-10-28 LAB — C-REACTIVE PROTEIN: CRP: 1 mg/L (ref 0–10)

## 2019-10-28 LAB — QUANTIFERON-TB GOLD PLUS
QuantiFERON Mitogen Value: 0.03 IU/mL
QuantiFERON Nil Value: 0.02 IU/mL
QuantiFERON TB1 Ag Value: 0.02 IU/mL
QuantiFERON TB2 Ag Value: 0.02 IU/mL
QuantiFERON-TB Gold Plus: UNDETERMINED — AB

## 2019-10-28 LAB — HEPATITIS B SURFACE ANTIBODY,QUALITATIVE: Hep B Surface Ab, Qual: REACTIVE

## 2019-10-28 LAB — THIOPURINE METHYLTRANSFERASE (TPMT), RBC: TPMT Activity:: 21.8 Units/mL RBC

## 2019-10-28 LAB — HEPATITIS B SURFACE ANTIGEN: Hepatitis B Surface Ag: NEGATIVE

## 2019-10-28 LAB — HEPATITIS B E ANTIGEN: Hep B E Ag: NEGATIVE

## 2019-10-28 LAB — HEPATITIS C ANTIBODY: Hep C Virus Ab: <0.1 s/co ratio (ref 0.0–0.9)

## 2019-10-28 LAB — HEPATITIS B E ANTIBODY: Hep B E Ab: NEGATIVE

## 2019-10-28 LAB — HIV ANTIBODY (ROUTINE TESTING W REFLEX): HIV Screen 4th Generation wRfx: NONREACTIVE

## 2019-10-30 ENCOUNTER — Encounter: Payer: Self-pay | Admitting: Gastroenterology

## 2019-10-31 ENCOUNTER — Other Ambulatory Visit: Payer: Self-pay

## 2019-10-31 ENCOUNTER — Telehealth: Payer: Self-pay

## 2019-10-31 DIAGNOSIS — K5000 Crohn's disease of small intestine without complications: Secondary | ICD-10-CM

## 2019-10-31 NOTE — Telephone Encounter (Signed)
-----   Message from Shelby Mattocks, [place] sent at 10/31/2019  9:35 AM EST -----  ----- Message ----- From: Jonathon Bellows, MD Sent: 10/30/2019   9:38 AM EST To: Shelby Mattocks, CMA  Caryl Pina inform  1. Hep A not immune - needs vaccine 2. TB quantiferon - indeterminate -refer to ID 3. She needs a PCP if does not have one

## 2019-10-31 NOTE — Telephone Encounter (Signed)
Spoke with pt and informed her of lab results and Dr. Georgeann Oppenheim recommendations. Pt understands and agrees to the referral and plans to set up care with a PCP today.

## 2019-11-02 ENCOUNTER — Other Ambulatory Visit: Payer: Self-pay | Admitting: Obstetrics and Gynecology

## 2019-11-02 DIAGNOSIS — F411 Generalized anxiety disorder: Secondary | ICD-10-CM

## 2019-11-07 ENCOUNTER — Ambulatory Visit
Admission: RE | Admit: 2019-11-07 | Discharge: 2019-11-07 | Disposition: A | Discharge status: Home / Self Care | Payer: Managed Care, Other (non HMO) | Source: Ambulatory Visit | Attending: Gastroenterology | Admitting: Gastroenterology

## 2019-11-07 ENCOUNTER — Ambulatory Visit (INDEPENDENT_AMBULATORY_CARE_PROVIDER_SITE_OTHER): Payer: Managed Care, Other (non HMO) | Admitting: Infectious Disease

## 2019-11-07 ENCOUNTER — Telehealth: Payer: Self-pay | Admitting: *Deleted

## 2019-11-07 ENCOUNTER — Other Ambulatory Visit: Payer: Self-pay

## 2019-11-07 ENCOUNTER — Encounter: Payer: Self-pay | Admitting: Gastroenterology

## 2019-11-07 ENCOUNTER — Encounter: Payer: Self-pay | Admitting: Infectious Disease

## 2019-11-07 VITALS — HR 100 | Ht 67.0 in | Wt 195.0 lb

## 2019-11-07 DIAGNOSIS — R7612 Nonspecific reaction to cell mediated immunity measurement of gamma interferon antigen response without active tuberculosis: Secondary | ICD-10-CM | POA: Diagnosis not present

## 2019-11-07 DIAGNOSIS — K56600 Partial intestinal obstruction, unspecified as to cause: Secondary | ICD-10-CM | POA: Diagnosis not present

## 2019-11-07 DIAGNOSIS — K5000 Crohn's disease of small intestine without complications: Secondary | ICD-10-CM

## 2019-11-07 DIAGNOSIS — K50812 Crohn's disease of both small and large intestine with intestinal obstruction: Secondary | ICD-10-CM

## 2019-11-07 HISTORY — DX: Nonspecific reaction to cell mediated immunity measurement of gamma interferon antigen response without active tuberculosis: R76.12

## 2019-11-07 MED ORDER — GADOBUTROL 1 MMOL/ML IV SOLN
8.0000 mL | Freq: Once | INTRAVENOUS | Status: AC | PRN
  Administered 2019-11-07: 8 mL via INTRAVENOUS

## 2019-11-07 NOTE — Progress Notes (Signed)
Subjective:   Reason for infectious disease consult: Indeterminate QuantiFERON gold  Requesting physician: Dr. Bailey Mech     Patient ID: Angela Braun, female    DOB: May 26, 1986, 34 y.o.   MRN: [id number]  HPI   Angela Braun is a 34 year old Caucasian lady who suffers from Crohn's disease who was recently hospitalized at [facility] with a small bowel obstruction.  She is being evaluated for treatment for Crohn's disease again with Biologics and had a QuantiFERON gold performed which was indeterminate.  She was therefore referred to our clinic for consideration of whether she could have latent tuberculosis or not.  She herself states that she has not been on therapy for Crohn's since 2007.  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  Her husband is a Engineer, structural who typically works in the emergency room and gets tested for TB as part of his job and is always tested negative.  She has lived essentially in [place] her entire life and not travel to high risk endemic areas for tuberculosis.  She has never been incarcerated and she is HIV negative.    Past Medical History:  Diagnosis Date  . Abnormal Pap smear of cervix   . Crohn disease ([place])   . Migraine   . Mitral valve prolapse   . 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 CESAREAN SECTION N/A 06/16/2018   Procedure: CESAREAN SECTION;  Surgeon: Will Bonnet, MD;  Location: [facility] ORS;  Service: Obstetrics;  Laterality: N/A;  . COLONOSCOPY      Family History  Problem Relation Age of Onset  . Ovarian cancer Maternal Aunt       Social History   Socioeconomic History  . Marital status: Married    Spouse name: Not on file  . Number of children: Not on file  . Years of education: Not on file  . Highest education level: Not on file  Occupational History  . Not on file  Tobacco  Use  . Smoking status: Never Smoker  . Smokeless tobacco: Never Used  Substance and Sexual Activity  . Alcohol use: No  . Drug use: No  . Sexual activity: Yes    Birth control/protection: Implant  Other Topics Concern  . Not on file  Social History Narrative  . Not on file   Social Determinants of Health   Financial Resource Strain:   . Difficulty of Paying Living Expenses: Not on file  Food Insecurity:   . Worried About Charity fundraiser in the Last Year: Not on file  . Ran Out of Food in the Last Year: Not on file  Transportation Needs:   . Lack of Transportation (Medical): Not on file  . Lack of Transportation (Non-Medical): Not on file  Physical Activity:   . Days of Exercise per Week: Not on file  . Minutes of Exercise per Session: Not on file  Stress:   . Feeling of Stress : Not on file  Social Connections:   . Frequency of Communication with Friends and Family: Not on file  . Frequency of Social Gatherings with Friends and Family: Not on file  . Attends Religious Services: Not on file  . Active Member of Clubs or Organizations: Not on file  . Attends Archivist Meetings: Not on file  . Marital Status: Not on file  Allergies  Allergen Reactions  . Penicillins Hives  . Latex Rash     Current Outpatient Medications:  .  FLUoxetine (PROZAC) 10 MG tablet, TAKE 1 TABLET BY MOUTH EVERY DAY. NEEDS APPOINTMENT FOR REFILLS, Disp: 90 tablet, Rfl: 0    Review of Systems  Constitutional: Negative for chills and fever.  HENT: Negative for congestion and sore throat.   Eyes: Negative for photophobia.  Respiratory: Negative for cough, shortness of breath and wheezing.   Cardiovascular: Negative for chest pain, palpitations and leg swelling.  Gastrointestinal: Negative for abdominal pain, blood in stool, constipation, diarrhea, nausea and vomiting.  Genitourinary: Negative for dysuria, flank pain and hematuria.  Musculoskeletal: Negative for back pain and  myalgias.  Skin: Negative for rash.  Neurological: Negative for dizziness, weakness and headaches.  Hematological: Does not bruise/bleed easily.  Psychiatric/Behavioral: Negative for agitation, behavioral problems, confusion, decreased concentration, dysphoric mood, hallucinations, self-injury and suicidal ideas. The patient is not nervous/anxious and is not hyperactive.        Objective:   Physical Exam Constitutional:      General: She is not in acute distress.    Appearance: She is not diaphoretic.  HENT:     Head: Normocephalic and atraumatic.     Right Ear: External ear normal.     Left Ear: External ear normal.     Nose: Nose normal.     Mouth/Throat:     Pharynx: No oropharyngeal exudate.  Eyes:     General: No scleral icterus.    Conjunctiva/sclera: Conjunctivae normal.     Pupils: Pupils are equal, round, and reactive to light.  Cardiovascular:     Rate and Rhythm: Normal rate and regular rhythm.  Pulmonary:     Effort: Pulmonary effort is normal. No respiratory distress.     Breath sounds: No wheezing.  Abdominal:     General: There is no distension.     Palpations: Abdomen is soft.  Musculoskeletal:        General: No tenderness. Normal range of motion.     Cervical back: Normal range of motion and neck supple.  Lymphadenopathy:     Cervical: No cervical adenopathy.  Skin:    General: Skin is warm and dry.     Coloration: Skin is not pale.     Findings: No erythema or rash.  Neurological:     General: No focal deficit present.     Mental Status: She is alert and oriented to person, place, and time.     Coordination: Coordination normal.  Psychiatric:        Attention and Perception: Attention and perception normal.        Mood and Affect: Affect normal. Mood is anxious.        Speech: Speech normal.        Behavior: Behavior normal.        Thought Content: Thought content normal.        Judgment: Judgment normal.           Assessment & Plan:    Indeterminate QuantiFERON gold: We will repeat QuantiFERON gold and also do a TB spot test. If both are negative will not pursue further. If either indeterminate will try to get TST testing done  Note she could have TST testing at local health dept or @ [facility] ID clinic with Dr Delaine Lame (she didn't like having to drive all the way to [facility] for this  Consult)  Crohn's disease: Being evaluated for Biologics

## 2019-11-07 NOTE — Telephone Encounter (Signed)
Patient given printed prescription for lab draw to take to [facility], as this is a specialty test [facility] is not able to draw on site. Copy placed in box for scanning into her chart. Landis Gandy, RN

## 2019-11-07 NOTE — Progress Notes (Signed)
Normal MR enterography with no evidence of active disease

## 2019-11-08 ENCOUNTER — Telehealth: Payer: Self-pay

## 2019-11-08 NOTE — Telephone Encounter (Signed)
Pt calling triage stating she has scheduled her annual exam for 3/29. She would like a refill of her Prozac and possibly increase dosage (SDJ had talked about this at last visit) before her appointment. CB# [phone]

## 2019-11-09 ENCOUNTER — Telehealth: Payer: Self-pay

## 2019-11-09 ENCOUNTER — Other Ambulatory Visit: Payer: Self-pay | Admitting: Obstetrics and Gynecology

## 2019-11-09 DIAGNOSIS — K5000 Crohn's disease of small intestine without complications: Secondary | ICD-10-CM

## 2019-11-09 DIAGNOSIS — F411 Generalized anxiety disorder: Secondary | ICD-10-CM

## 2019-11-09 MED ORDER — FLUOXETINE HCL 20 MG PO TABS: 20.0000 mg | ORAL_TABLET | Freq: Every day | ORAL | 0 refills | Status: DC

## 2019-11-09 NOTE — Telephone Encounter (Signed)
-----   Message from Jonathon Bellows, MD sent at 11/08/2019  8:35 AM EST ----- If still having diarrhea get stools tests and fecal calprotectin and then needs a colonoscopy once stool is negative. Can schedule colonoscopy in 4-5 weeks and in meanwhile get stool tests  ----- Message ----- From: Rushie Chestnut, [place] Sent: 11/07/2019   4:50 PM EST To: Jonathon Bellows, MD  Dr. Vicente Males, what is your plan for the next steps? Does pt need to proceed with colonoscopy? ----- Message ----- From: Jonathon Bellows, MD Sent: 11/07/2019  12:00 PM EST To: Rushie Chestnut, CMA  Normal MR enterography with no evidence of active disease

## 2019-11-09 NOTE — Telephone Encounter (Signed)
Called pt to inform he of MRI result and Dr. Johnney Killian plan for next steps.  Unable to contact, LVM to return call

## 2019-11-09 NOTE — Telephone Encounter (Signed)
ERx sent with increased dosage. We can adjust, if necessary at her next appt.

## 2019-11-10 ENCOUNTER — Other Ambulatory Visit: Payer: Self-pay

## 2019-11-10 ENCOUNTER — Ambulatory Visit: Payer: Managed Care, Other (non HMO) | Admitting: Nurse Practitioner

## 2019-11-10 ENCOUNTER — Encounter: Payer: Self-pay | Admitting: Nurse Practitioner

## 2019-11-10 VITALS — BP 110/74 | HR 81 | Temp 97.6°F | Resp 16 | Ht 67.0 in | Wt 194.4 lb

## 2019-11-10 DIAGNOSIS — Z23 Encounter for immunization: Secondary | ICD-10-CM

## 2019-11-10 DIAGNOSIS — F411 Generalized anxiety disorder: Secondary | ICD-10-CM | POA: Diagnosis not present

## 2019-11-10 NOTE — Patient Instructions (Addendum)
It was great to meet you today. You received the Hepatis A vaccine today. You should not expect any side effects except possible sore arm.  Call us with any questions or concerns  Follow up for a complete physical exam in 3 mos.

## 2019-11-10 NOTE — Progress Notes (Signed)
Subjective:    Patient ID: Angela Braun, female    DOB: [birth year], 34 y.o.   MRN: [id number]  HPI  This 34 yo healthy female comes in to establish care with a PCP and to get a preventative Hepatitis A vaccine today. She has a history of anxiety and reports stable on current fluoxetine therapy.   She was hospitalized in 05/2019 for abdominal pain and diarrhea with the CT of abdomen revealing mild, partial SBO.  She was found not to have Crohn's on imaging. She has gallstones without cholecystitis. Dr. Vicente Males worked her up for Crohn's with stool studies, blood work and MRE imaging-negative for [employer]. Colonoscopy is pending.  She had blood work for possible biologics and  tested indeterminate for Quant gold TB screen. She was seen by Dr. Tommy Medal in [place] on Mon and had a repeat Quant gold test. 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  She has had no known TB exposure. She was told to get a Hepatitis A vaccine as she is not immune. She does have immunity to Hepatitis B.   She has no specific complaints today and feels well. Her gallbladder may bother her when she eats bacon, so she avoids it. There was no recommendations for surgery since she remains asymptomatic.    Review of Systems  Constitutional: Negative for appetite change, chills and fever.  HENT: Negative.   Respiratory: Negative.   Cardiovascular: Negative.   Gastrointestinal: Negative for abdominal pain, blood in stool, constipation and diarrhea.  Endocrine: Negative.   Musculoskeletal: Negative.   Hematological: Negative.   Psychiatric/Behavioral:       She has no depression. Reports stress as she lost her job and is home with children full time. 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   Past Medical History:  Diagnosis Date  . Abnormal Pap smear of cervix   . Crohn disease ([place])   . Migraine   . Mitral valve  prolapse   . 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 CESAREAN SECTION N/A 06/16/2018   Procedure: CESAREAN SECTION;  Surgeon: Will Bonnet, MD;  Location: [facility] ORS;  Service: Obstetrics;  Laterality: N/A;  . COLONOSCOPY     Social: Reviewed. Married with children. Non smoker  Family History  Problem Relation Age of Onset  . Kidney disease Mother   . Cancer Father   . Ovarian cancer Maternal Aunt     Allergies  Allergen Reactions  . Penicillins Hives  . Latex Rash    Current Outpatient Medications on File Prior to Visit  Medication Sig Dispense Refill  . FLUoxetine (PROZAC) 20 MG tablet Take 1 tablet (20 mg total) by mouth daily. 90 tablet 0   No current facility-administered medications on file prior to visit.    BP 110/74   Pulse 81   Temp 97.6 F (36.4 C) (Temporal)   Resp 16   Ht 5' 7"  (1.702 m)   Wt 194 lb 6.4 oz (88.2 kg)   SpO2 98%   BMI 30.45 kg/m      Objective:   Physical Exam Vitals reviewed.  Constitutional:      Appearance: Normal appearance.  Cardiovascular:     Rate and Rhythm: Normal rate and regular rhythm.     Heart sounds: Normal heart sounds.  Pulmonary:     Effort: Pulmonary effort is normal.     Breath sounds: Normal breath sounds.  Abdominal:     Palpations: Abdomen is soft.     Tenderness: There is no abdominal tenderness.  Skin:    General: Skin is warm and dry.  Neurological:     General: No focal deficit present.     Mental Status: She is oriented to person, place, and time.  Psychiatric:        Mood and Affect: Mood normal.        Behavior: Behavior normal.       Assessment & Plan:   Need for Hepatitis A vaccine. Given today in the office.  History of GAD- GAD-7 score is 8.  Recent work up for possible Crohn's in progress. Records reviewed.  Return for CPE in 3 mos

## 2019-11-11 ENCOUNTER — Encounter: Payer: Self-pay | Admitting: Infectious Disease

## 2019-11-11 ENCOUNTER — Other Ambulatory Visit: Payer: Self-pay

## 2019-11-11 DIAGNOSIS — K5000 Crohn's disease of small intestine without complications: Secondary | ICD-10-CM

## 2019-11-11 MED ORDER — NA SULFATE-K SULFATE-MG SULF 17.5-3.13-1.6 GM/177ML PO SOLN: 354.0000 mL | Freq: Once | ORAL | 0 refills | Status: AC

## 2019-11-11 NOTE — Telephone Encounter (Signed)
Ok can schedule colonoscopy

## 2019-11-11 NOTE — Telephone Encounter (Signed)
Patient is not having diarrhea anymore or no symptoms.

## 2019-11-11 NOTE — Telephone Encounter (Signed)
pATIENT SCHEDULED COLONOSCOPY FOR 12/01/2019. wENT OVER INSTRUCTIONS WITH PATIENT AND MAILED THEM AND SENT TO [facility]. sENT PREP TO PHARMACY

## 2019-11-12 ENCOUNTER — Encounter: Payer: Self-pay | Admitting: Nurse Practitioner

## 2019-11-14 NOTE — Telephone Encounter (Signed)
FYI

## 2019-11-21 ENCOUNTER — Telehealth: Payer: Self-pay | Admitting: Infectious Disease

## 2019-11-21 NOTE — Telephone Encounter (Signed)
Relayed to patient who voiced understanding. Thanks! Landis Gandy, RN

## 2019-11-21 NOTE — Telephone Encounter (Signed)
Both TB tests were negative. No need for further workup and can proceed

## 2019-11-24 ENCOUNTER — Ambulatory Visit: Payer: Managed Care, Other (non HMO) | Admitting: Gastroenterology

## 2019-11-29 ENCOUNTER — Other Ambulatory Visit
Admission: RE | Admit: 2019-11-29 | Discharge: 2019-11-29 | Disposition: A | Discharge status: Home / Self Care | Payer: Managed Care, Other (non HMO) | Source: Ambulatory Visit | Attending: Gastroenterology | Admitting: Gastroenterology

## 2019-11-29 ENCOUNTER — Other Ambulatory Visit: Payer: Self-pay

## 2019-11-29 DIAGNOSIS — Z91040 Latex allergy status: Secondary | ICD-10-CM | POA: Diagnosis not present

## 2019-11-29 DIAGNOSIS — I341 Nonrheumatic mitral (valve) prolapse: Secondary | ICD-10-CM | POA: Diagnosis not present

## 2019-11-29 DIAGNOSIS — F419 Anxiety disorder, unspecified: Secondary | ICD-10-CM | POA: Diagnosis not present

## 2019-11-29 DIAGNOSIS — Z79899 Other long term (current) drug therapy: Secondary | ICD-10-CM | POA: Diagnosis not present

## 2019-11-29 DIAGNOSIS — Z20822 Contact with and (suspected) exposure to covid-19: Secondary | ICD-10-CM | POA: Diagnosis not present

## 2019-11-29 DIAGNOSIS — Z880 Allergy status to penicillin: Secondary | ICD-10-CM | POA: Diagnosis not present

## 2019-11-29 DIAGNOSIS — K5090 Crohn's disease, unspecified, without complications: Secondary | ICD-10-CM | POA: Diagnosis present

## 2019-11-29 LAB — SARS CORONAVIRUS 2 (TAT 6-24 HRS): SARS Coronavirus 2: NEGATIVE

## 2019-12-01 ENCOUNTER — Other Ambulatory Visit: Payer: Self-pay

## 2019-12-01 ENCOUNTER — Ambulatory Visit: Payer: Managed Care, Other (non HMO) | Admitting: Certified Registered Nurse Anesthetist

## 2019-12-01 ENCOUNTER — Ambulatory Visit
Admission: RE | Admit: 2019-12-01 | Discharge: 2019-12-01 | Disposition: A | Discharge status: Home / Self Care | Payer: Managed Care, Other (non HMO) | Source: Ambulatory Visit | Attending: Gastroenterology | Admitting: Gastroenterology

## 2019-12-01 ENCOUNTER — Encounter: Payer: Self-pay | Admitting: Gastroenterology

## 2019-12-01 ENCOUNTER — Encounter
Admission: RE | Disposition: A | Discharge status: Home / Self Care | Payer: Self-pay | Source: Ambulatory Visit | Attending: Gastroenterology

## 2019-12-01 DIAGNOSIS — F419 Anxiety disorder, unspecified: Secondary | ICD-10-CM | POA: Insufficient documentation

## 2019-12-01 DIAGNOSIS — Z20822 Contact with and (suspected) exposure to covid-19: Secondary | ICD-10-CM | POA: Insufficient documentation

## 2019-12-01 DIAGNOSIS — I341 Nonrheumatic mitral (valve) prolapse: Secondary | ICD-10-CM | POA: Insufficient documentation

## 2019-12-01 DIAGNOSIS — K5090 Crohn's disease, unspecified, without complications: Secondary | ICD-10-CM | POA: Diagnosis not present

## 2019-12-01 DIAGNOSIS — Z79899 Other long term (current) drug therapy: Secondary | ICD-10-CM | POA: Insufficient documentation

## 2019-12-01 DIAGNOSIS — Z91040 Latex allergy status: Secondary | ICD-10-CM | POA: Insufficient documentation

## 2019-12-01 DIAGNOSIS — K5000 Crohn's disease of small intestine without complications: Secondary | ICD-10-CM

## 2019-12-01 DIAGNOSIS — Z880 Allergy status to penicillin: Secondary | ICD-10-CM | POA: Insufficient documentation

## 2019-12-01 HISTORY — PX: COLONOSCOPY WITH PROPOFOL: SHX5780

## 2019-12-01 LAB — POCT PREGNANCY, URINE: Preg Test, Ur: NEGATIVE

## 2019-12-01 SURGERY — COLONOSCOPY WITH PROPOFOL
Anesthesia: General

## 2019-12-01 MED ORDER — ONDANSETRON HCL 4 MG/2ML IJ SOLN
INTRAMUSCULAR | Status: DC | PRN
  Administered 2019-12-01: 4 mg via INTRAVENOUS

## 2019-12-01 MED ORDER — SODIUM CHLORIDE 0.9 % IV SOLN: INTRAVENOUS | Status: DC

## 2019-12-01 MED ORDER — PROPOFOL 500 MG/50ML IV EMUL
INTRAVENOUS | Status: DC | PRN
  Administered 2019-12-01: 100 ug/kg/min via INTRAVENOUS

## 2019-12-01 MED ORDER — PROPOFOL 10 MG/ML IV BOLUS
INTRAVENOUS | Status: DC | PRN
  Administered 2019-12-01: 30 mg via INTRAVENOUS
  Administered 2019-12-01 (×2): 50 mg via INTRAVENOUS

## 2019-12-01 MED ORDER — LIDOCAINE HCL (CARDIAC) PF 100 MG/5ML IV SOSY
PREFILLED_SYRINGE | INTRAVENOUS | Status: DC | PRN
  Administered 2019-12-01: 100 mg via INTRAVENOUS

## 2019-12-01 MED ORDER — MIDAZOLAM HCL 2 MG/2ML IJ SOLN
INTRAMUSCULAR | Status: DC | PRN
  Administered 2019-12-01: 2 mg via INTRAVENOUS

## 2019-12-01 MED ORDER — PROPOFOL 10 MG/ML IV BOLUS
INTRAVENOUS | Status: AC
  Filled 2019-12-01: qty 40

## 2019-12-01 MED ORDER — LIDOCAINE HCL (PF) 2 % IJ SOLN
INTRAMUSCULAR | Status: AC
  Filled 2019-12-01: qty 10

## 2019-12-01 MED ORDER — ONDANSETRON HCL 4 MG/2ML IJ SOLN
INTRAMUSCULAR | Status: AC
  Filled 2019-12-01: qty 2

## 2019-12-01 MED ORDER — MIDAZOLAM HCL 2 MG/2ML IJ SOLN
INTRAMUSCULAR | Status: AC
  Filled 2019-12-01: qty 2

## 2019-12-01 NOTE — Anesthesia Preprocedure Evaluation (Signed)
Anesthesia Evaluation  Patient identified by MRN, date of birth, ID band Patient awake    Reviewed: Allergy & Precautions, H&P , NPO status , Patient's Chart, lab work & pertinent test results, reviewed documented beta blocker date and time   Airway Mallampati: II   Neck ROM: full    Dental  (+) Poor Dentition   Pulmonary neg pulmonary ROS,    Pulmonary exam normal        Cardiovascular Exercise Tolerance: Good negative cardio ROS Normal cardiovascular exam Rhythm:regular Rate:Normal     Neuro/Psych  Headaches, PSYCHIATRIC DISORDERS Anxiety    GI/Hepatic negative GI ROS, Neg liver ROS,   Endo/Other  negative endocrine ROS  Renal/GU negative Renal ROS  negative genitourinary   Musculoskeletal   Abdominal   Peds  Hematology negative hematology ROS (+)   Anesthesia Other Findings Past Medical History: No date: Abnormal Pap smear of cervix No date: Crohn disease ([place]) No date: Migraine No date: Mitral valve prolapse 11/07/2019: Positive QuantiFERON-TB Gold test Past Surgical History: 06/16/2018: CESAREAN SECTION; N/A     Comment:  Procedure: CESAREAN SECTION;  Surgeon: Will Bonnet, MD;  Location: [facility] ORS;  Service: Obstetrics;                Laterality: N/A; No date: COLONOSCOPY BMI    Body Mass Index: 30.83 kg/m     Reproductive/Obstetrics negative OB ROS                             Anesthesia Physical Anesthesia Plan  ASA: II  Anesthesia Plan: General   Post-op Pain Management:    Induction:   PONV Risk Score and Plan:   Airway Management Planned:   Additional Equipment:   Intra-op Plan:   Post-operative Plan:   Informed Consent: I have reviewed the patients History and Physical, chart, labs and discussed the procedure including the risks, benefits and alternatives for the proposed anesthesia with the patient or authorized representative who has  indicated his/her understanding and acceptance.     Dental Advisory Given  Plan Discussed with: CRNA  Anesthesia Plan Comments:         Anesthesia Quick Evaluation

## 2019-12-01 NOTE — Transfer of Care (Signed)
Immediate Anesthesia Transfer of Care Note  Patient: Angela Braun  Procedure(s) Performed: COLONOSCOPY WITH PROPOFOL (N/A )  Patient Location: PACU and Endoscopy Unit  Anesthesia Type:General  Level of Consciousness: drowsy and patient cooperative  Airway & Oxygen Therapy: Patient Spontanous Breathing  Post-op Assessment: Report given to RN and Post -op Vital signs reviewed and stable  Post vital signs: Reviewed and stable  Last Vitals:  Vitals Value Taken Time  BP 89/60 12/01/19 1029  Temp 36.7 C 12/01/19 1027  Pulse 66 12/01/19 1029  Resp 15 12/01/19 1029  SpO2 100 % 12/01/19 1029  Vitals shown include unvalidated device data.  Last Pain:  Vitals:   12/01/19 1027  TempSrc: Temporal  PainSc: Asleep         Complications: No apparent anesthesia complications

## 2019-12-01 NOTE — H&P (Signed)
Jonathon Bellows, MD [address], [place], [place], [place], [address] [address], [place], [place], [place], [address] Phone: [phone]  Fax: [phone]  Primary Care Physician:  Marval Regal, NP   Pre-Procedure History & Physical: HPI:  Angela Braun is a 34 y.o. female is here for an colonoscopy.   Past Medical History:  Diagnosis Date  . Abnormal Pap smear of cervix   . Crohn disease ([place])   . Migraine   . Mitral valve prolapse   . 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 CESAREAN SECTION N/A 06/16/2018   Procedure: CESAREAN SECTION;  Surgeon: Will Bonnet, MD;  Location: [facility] ORS;  Service: Obstetrics;  Laterality: N/A;  . COLONOSCOPY      Prior to Admission medications   Medication Sig Start Date End Date Taking? Authorizing Provider  FLUoxetine (PROZAC) 20 MG tablet Take 1 tablet (20 mg total) by mouth daily. 11/09/19  Yes Will Bonnet, MD    Allergies as of 11/11/2019 - Review Complete 11/10/2019  Allergen Reaction Noted  . Penicillins Hives 05/26/2014  . Latex Rash 06/16/2018    Family History  Problem Relation Age of Onset  . Kidney disease Mother   . Cancer Father   . Ovarian cancer Maternal Aunt     Social History   Socioeconomic History  . Marital status: Married    Spouse name: Not on file  . Number of children: Not on file  . Years of education: Not on file  . Highest education level: Not on file  Occupational History  . Not on file  Tobacco Use  . Smoking status: Never Smoker  . Smokeless tobacco: Never Used  Substance and Sexual Activity  . Alcohol use: Yes    Comment: OCASSIONALLY  . Drug use: No  . Sexual activity: Yes    Birth control/protection: Implant  Other Topics Concern  . Not on file  Social History Narrative  . Not on file   Social Determinants of Health   Financial Resource Strain:   . Difficulty of Paying Living Expenses:   Food  Insecurity:   . Worried About Charity fundraiser in the Last Year:   . Arboriculturist in the Last Year:   Transportation Needs:   . Film/video editor (Medical):   Marland Kitchen Lack of Transportation (Non-Medical):   Physical Activity:   . Days of Exercise per Week:   . Minutes of Exercise per Session:   Stress:   . Feeling of Stress :   Social Connections:   . Frequency of Communication with Friends and Family:   . Frequency of Social Gatherings with Friends and Family:   . Attends Religious Services:   . Active Member of Clubs or Organizations:   . Attends Archivist Meetings:   Marland Kitchen Marital Status:   Intimate Partner Violence:   . Fear of Current or Ex-Partner:   . Emotionally Abused:   Marland Kitchen Physically Abused:   . Sexually Abused:     Review of Systems: See HPI, otherwise negative ROS  Physical Exam: BP 109/76   Pulse 79   Temp (!) 97.3 F (36.3 C) (Temporal)   Resp 16   Ht 5' 6"  (1.676 m)   Wt 86.6 kg   SpO2 100%   BMI 30.83 kg/m  General:   Alert,  pleasant and cooperative in NAD Head:  Normocephalic and atraumatic. Neck:  Supple; no masses or thyromegaly. Lungs:  Clear throughout to auscultation, normal respiratory effort.    Heart:  +S1, +S2, Regular rate and rhythm, No edema. Abdomen:  Soft, nontender and nondistended. Normal bowel sounds, without guarding, and without rebound.   Neurologic:  Alert and  oriented x4;  grossly normal neurologically.  Impression/Plan: Angela Braun is here for an colonoscopy to be performed evaluation of crohns disease.  Risks, benefits, limitations, and alternatives regarding  colonoscopy have been reviewed with the patient.  Questions have been answered.  All parties agreeable.   Jonathon Bellows, MD  12/01/2019, 9:53 AM

## 2019-12-01 NOTE — Op Note (Signed)
[facility] Gastroenterology Patient Name: Angela Braun Procedure Date: 12/01/2019 9:53 AM MRN: [id number] Account #: [id number] Date of Birth: 12-Dec-1985 Admit Type: Outpatient Age: 34 Room: [facility] ENDO ROOM 4 Gender: Female Note Status: Finalized Procedure:             Colonoscopy Indications:           Follow-up of Crohn's disease Providers:             Jonathon Bellows MD, MD Medicines:             Monitored Anesthesia Care Complications:         No immediate complications. Procedure:             Pre-Anesthesia Assessment:                        - ASA Grade Assessment: II - A patient with mild                         systemic disease.                        After obtaining informed consent, the colonoscope was                         passed under direct vision. Throughout the procedure,                         the patient's blood pressure, pulse, and oxygen                         saturations were monitored continuously. The                         Colonoscope was introduced through the anus and                         advanced to the the terminal ileum. The colonoscopy                         was performed with ease. The patient tolerated the                         procedure well. The quality of the bowel preparation                         was excellent. Findings:      The terminal ileum appeared normal.      The entire examined colon appeared normal on direct and retroflexion       views. Impression:            - The examined portion of the ileum was normal.                        - The entire examined colon is normal on direct and                         retroflexion views.                        - No specimens collected.  Recommendation:        - Discharge patient to home (with escort).                        - Resume previous diet.                        - Continue present medications.                        - Return to my office in 6 months. Procedure  Code(s):     --- Professional ---                        [phone], Colonoscopy, flexible; diagnostic, including                         collection of specimen(s) by brushing or washing, when                         performed (separate procedure) Diagnosis Code(s):     --- Professional ---                        K50.90, Crohn's disease, unspecified, without                         complications CPT copyright 2019 American Medical Association. All rights reserved. The codes documented in this report are preliminary and upon coder review may  be revised to meet current compliance requirements. Jonathon Bellows, MD Jonathon Bellows MD, MD 12/01/2019 10:26:54 AM This report has been signed electronically. Number of Addenda: 0 Note Initiated On: 12/01/2019 9:53 AM Scope Withdrawal Time: 0 hours 10 minutes 2 seconds  Total Procedure Duration: 0 hours 13 minutes 52 seconds  Estimated Blood Loss:  Estimated blood loss: none.      [facility]

## 2019-12-02 ENCOUNTER — Encounter: Payer: Self-pay | Admitting: *Deleted

## 2019-12-05 ENCOUNTER — Encounter: Payer: Self-pay | Admitting: Obstetrics and Gynecology

## 2019-12-05 ENCOUNTER — Other Ambulatory Visit: Payer: Self-pay

## 2019-12-05 ENCOUNTER — Other Ambulatory Visit (HOSPITAL_COMMUNITY)
Admission: RE | Admit: 2019-12-05 | Discharge: 2019-12-05 | Disposition: A | Discharge status: Home / Self Care | Payer: Managed Care, Other (non HMO) | Source: Ambulatory Visit | Attending: Obstetrics and Gynecology | Admitting: Obstetrics and Gynecology

## 2019-12-05 ENCOUNTER — Ambulatory Visit (INDEPENDENT_AMBULATORY_CARE_PROVIDER_SITE_OTHER): Payer: Managed Care, Other (non HMO) | Admitting: Obstetrics and Gynecology

## 2019-12-05 VITALS — BP 118/74 | Ht 66.0 in | Wt 192.0 lb

## 2019-12-05 DIAGNOSIS — Z1331 Encounter for screening for depression: Secondary | ICD-10-CM

## 2019-12-05 DIAGNOSIS — Z01419 Encounter for gynecological examination (general) (routine) without abnormal findings: Secondary | ICD-10-CM

## 2019-12-05 DIAGNOSIS — Z124 Encounter for screening for malignant neoplasm of cervix: Secondary | ICD-10-CM | POA: Insufficient documentation

## 2019-12-05 DIAGNOSIS — G4709 Other insomnia: Secondary | ICD-10-CM

## 2019-12-05 DIAGNOSIS — Z1339 Encounter for screening examination for other mental health and behavioral disorders: Secondary | ICD-10-CM

## 2019-12-05 DIAGNOSIS — F411 Generalized anxiety disorder: Secondary | ICD-10-CM | POA: Diagnosis not present

## 2019-12-05 DIAGNOSIS — Z30431 Encounter for routine checking of intrauterine contraceptive device: Secondary | ICD-10-CM

## 2019-12-05 NOTE — Progress Notes (Signed)
Gynecology Annual Exam  PCP: Marval Regal, NP  Chief Complaint  Patient presents with  . Gynecologic Exam   History of Present Illness:  [name] is a 34 y.o. [phone] who LMP was No LMP recorded. (Menstrual status: IUD)., presents today for her annual examination.  Her menses are absent due to the IUD.   She is sexually active. She uses an IUD for contraception.   Last Pap: 2017  Results were: no abnormalities /neg HPV DNA  Hx of STDs: none  There is no FH of breast cancer. There is no FH of ovarian cancer. The patient does do self-breast exams.  Tobacco use: The patient denies current or previous tobacco use. Alcohol use: social drinker Exercise: she states she tries.   The patient wears seatbelts: yes.   The patient reports that domestic violence in her life is absent.   She notes that she has had some GI issues and is followed by GI for this.  For her anxiety/depression: She has been taking Prozac 20 mg for her symptoms.  She had a recent increase in Prozac from 10 mg to 20 mg.  She notes no changes or improvements in her symptoms.  She denies SI/HI.  She sometimes feels overwhelmed and has trouble getting to sleep.   Past Medical History:  Diagnosis Date  . Abnormal Pap smear of cervix   . Crohn disease ([place])   . Migraine   . Mitral valve prolapse   . 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 CESAREAN SECTION N/A 06/16/2018   Procedure: CESAREAN SECTION;  Surgeon: Will Bonnet, MD;  Location: [facility] ORS;  Service: Obstetrics;  Laterality: N/A;  . COLONOSCOPY    . COLONOSCOPY WITH PROPOFOL N/A 12/01/2019   Procedure: COLONOSCOPY WITH PROPOFOL;  Surgeon: Jonathon Bellows, MD;  Location: [facility] ENDOSCOPY;  Service: Gastroenterology;  Laterality: N/A;    Prior to Admission medications   Medication Sig Start Date End Date Taking? Authorizing Provider  FLUoxetine (PROZAC) 20 MG tablet Take 1 tablet (20 mg  total) by mouth daily. 11/09/19  Yes Will Bonnet, MD  levonorgestrel (LILETTA, 52 MG,) 19.5 MCG/DAY IUD IUD 1 each by Intrauterine route once.   Yes [provider]    Allergies  Allergen Reactions  . Penicillins Hives  . Latex Rash   Obstetric History: G2P1103, s/p c-section x 1  Social History   Socioeconomic History  . Marital status: Married    Spouse name: Not on file  . Number of children: Not on file  . Years of education: Not on file  . Highest education level: Not on file  Occupational History  . Not on file  Tobacco Use  . Smoking status: Never Smoker  . Smokeless tobacco: Never Used  Substance and Sexual Activity  . Alcohol use: Yes    Comment: OCASSIONALLY  . Drug use: No  . Sexual activity: Yes    Birth control/protection: Implant  Other Topics Concern  . Not on file  Social History Narrative  . Not on file   Social Determinants of Health   Financial Resource Strain:   . Difficulty of Paying Living Expenses:   Food Insecurity:   . Worried About Charity fundraiser in the Last Year:   . Arboriculturist in the Last Year:   Transportation Needs:   . Film/video editor (Medical):   Marland Kitchen Lack of Transportation (Non-Medical):  Physical Activity:   . Days of Exercise per Week:   . Minutes of Exercise per Session:   Stress:   . Feeling of Stress :   Social Connections:   . Frequency of Communication with Friends and Family:   . Frequency of Social Gatherings with Friends and Family:   . Attends Religious Services:   . Active Member of Clubs or Organizations:   . Attends Archivist Meetings:   Marland Kitchen Marital Status:   Intimate Partner Violence:   . Fear of Current or Ex-Partner:   . Emotionally Abused:   Marland Kitchen Physically Abused:   . Sexually Abused:     Family History  Problem Relation Age of Onset  . Kidney disease Mother   . Cancer Father   . Ovarian cancer Maternal Aunt    Review of Systems  Constitutional: Negative.    HENT: Negative.   Eyes: Negative.   Respiratory: Negative.   Cardiovascular: Negative.   Gastrointestinal: Negative.   Genitourinary: Negative.   Musculoskeletal: Negative.   Skin: Negative.   Neurological: Negative.   Psychiatric/Behavioral: Negative.    Physical Exam BP 118/74   Ht 5' 6"  (1.676 m)   Wt 192 lb (87.1 kg)   BMI 30.99 kg/m    Physical Exam Constitutional:      General: She is not in acute distress.    Appearance: Normal appearance. She is well-developed.  Genitourinary:     Pelvic exam was performed with patient in the lithotomy position.     Vulva, urethra, bladder and uterus normal.     No inguinal adenopathy present in the right or left side.    No signs of injury in the vagina.     No vaginal discharge, erythema, tenderness or bleeding.     No cervical motion tenderness, discharge, lesion or polyp.     IUD strings visualized.     Uterus is mobile.     Uterus is not enlarged or tender.     No uterine mass detected.    Uterus is anteverted.     No right or left adnexal mass present.     Right adnexa not tender or full.     Left adnexa not tender or full.  HENT:     Head: Normocephalic and atraumatic.  Eyes:     General: No scleral icterus.    Conjunctiva/sclera: Conjunctivae normal.  Neck:     Thyroid: No thyromegaly.  Cardiovascular:     Rate and Rhythm: Normal rate and regular rhythm.     Heart sounds: No murmur. No friction rub. No gallop.   Pulmonary:     Effort: Pulmonary effort is normal. No respiratory distress.     Breath sounds: Normal breath sounds. No wheezing or rales.  Chest:     Breasts:        Right: No inverted nipple, mass, nipple discharge, skin change or tenderness.        Left: No inverted nipple, mass, nipple discharge, skin change or tenderness.  Abdominal:     General: Bowel sounds are normal. There is no distension.     Palpations: Abdomen is soft. There is no mass.     Tenderness: There is no abdominal tenderness.  There is no guarding or rebound.  Musculoskeletal:        General: No swelling or tenderness. Normal range of motion.     Cervical back: Normal range of motion and neck supple.  Lymphadenopathy:     Cervical: No cervical  adenopathy.     Lower Body: No right inguinal adenopathy. No left inguinal adenopathy.  Neurological:     General: No focal deficit present.     Mental Status: She is alert and oriented to person, place, and time.     Cranial Nerves: No cranial nerve deficit.  Skin:    General: Skin is warm and dry.     Findings: No erythema or rash.  Psychiatric:        Mood and Affect: Mood normal.        Behavior: Behavior normal.        Judgment: Judgment normal.    Female chaperone present for pelvic and breast  portions of the physical exam  Results: AUDIT Questionnaire (screen for alcoholism): 4 PHQ-9: 10 Assessment: 34 y.o. G9P1103 female here for routine annual gynecologic examination  Plan: Problem List Items Addressed This Visit      Other   Generalized anxiety disorder   Relevant Medications   FLUoxetine (PROZAC) 20 MG tablet   traZODone (DESYREL) 50 MG tablet    Other Visit Diagnoses    Women's annual routine gynecological examination    -  Primary   Relevant Orders   Cytology - PAP   Screening for depression       Screening for alcoholism       Pap smear for cervical cancer screening       Relevant Orders   Cytology - PAP   Other insomnia       Relevant Medications   traZODone (DESYREL) 50 MG tablet      Screening: -- Blood pressure screen normal -- Weight screening: normal -- Depression screening negative (PHQ-9) -- Nutrition: normal -- cholesterol screening: not due for screening -- osteoporosis screening: not due -- tobacco screening: not using -- alcohol screening: AUDIT questionnaire indicates low-risk usage. -- family history of breast cancer screening: done. not at high risk. -- no evidence of domestic violence or intimate partner  violence. -- STD screening: gonorrhea/chlamydia NAAT not collected per patient request. -- pap smear collected per ASCCP guidelines  Anxiety/depression: increase prozac to 30 mg per day. May need to consider an additional agent. Insomnia: add trazodone prn. Denies SI/HI.   Prentice Docker, MD 12/05/2019 5:35 PM

## 2019-12-05 NOTE — Anesthesia Postprocedure Evaluation (Signed)
Anesthesia Post Note  Patient: Marshell Garfinkel  Procedure(s) Performed: COLONOSCOPY WITH PROPOFOL (N/A )  Patient location during evaluation: PACU Anesthesia Type: General Level of consciousness: awake and alert Pain management: pain level controlled Vital Signs Assessment: post-procedure vital signs reviewed and stable Respiratory status: spontaneous breathing, nonlabored ventilation, respiratory function stable and patient connected to nasal cannula oxygen Cardiovascular status: blood pressure returned to baseline and stable Postop Assessment: no apparent nausea or vomiting Anesthetic complications: no     Last Vitals:  Vitals:   12/01/19 0903 12/01/19 1027  BP: 109/76 (!) 89/60  Pulse: 79 75  Resp: 16 15  Temp: (!) 36.3 C 36.7 C  SpO2: 100% 100%    Last Pain:  Vitals:   12/02/19 0750  TempSrc:   PainSc: 0-No pain                 Molli Barrows

## 2019-12-06 ENCOUNTER — Encounter: Payer: Self-pay | Admitting: Obstetrics and Gynecology

## 2019-12-06 MED ORDER — TRAZODONE HCL 50 MG PO TABS: 50.0000 mg | ORAL_TABLET | Freq: Every evening | ORAL | 0 refills | Status: DC | PRN

## 2019-12-06 MED ORDER — FLUOXETINE HCL 20 MG PO TABS: 30.0000 mg | ORAL_TABLET | Freq: Every day | ORAL | 0 refills | Status: DC

## 2019-12-07 LAB — CYTOLOGY - PAP
Comment: NEGATIVE
Diagnosis: NEGATIVE
High risk HPV: NEGATIVE

## 2020-01-06 ENCOUNTER — Ambulatory Visit: Payer: Managed Care, Other (non HMO) | Admitting: Obstetrics and Gynecology

## 2020-02-05 ENCOUNTER — Other Ambulatory Visit: Payer: Self-pay | Admitting: Obstetrics and Gynecology

## 2020-02-05 DIAGNOSIS — F411 Generalized anxiety disorder: Secondary | ICD-10-CM

## 2020-02-10 ENCOUNTER — Encounter: Payer: Managed Care, Other (non HMO) | Admitting: Nurse Practitioner

## 2020-03-01 ENCOUNTER — Other Ambulatory Visit: Payer: Self-pay | Admitting: Obstetrics and Gynecology

## 2020-03-01 DIAGNOSIS — G4709 Other insomnia: Secondary | ICD-10-CM

## 2020-03-01 NOTE — Telephone Encounter (Signed)
Advise

## 2020-03-13 ENCOUNTER — Other Ambulatory Visit: Payer: Self-pay | Admitting: Obstetrics and Gynecology

## 2020-03-13 DIAGNOSIS — F411 Generalized anxiety disorder: Secondary | ICD-10-CM

## 2020-03-21 ENCOUNTER — Other Ambulatory Visit: Payer: Self-pay

## 2020-03-21 DIAGNOSIS — F411 Generalized anxiety disorder: Secondary | ICD-10-CM

## 2020-03-22 NOTE — Telephone Encounter (Signed)
Advise

## 2020-03-23 MED ORDER — FLUOXETINE HCL 20 MG PO TABS: 30.0000 mg | ORAL_TABLET | Freq: Every day | ORAL | 0 refills | Status: DC

## 2020-05-24 ENCOUNTER — Other Ambulatory Visit: Payer: Self-pay | Admitting: Obstetrics and Gynecology

## 2020-05-24 DIAGNOSIS — F411 Generalized anxiety disorder: Secondary | ICD-10-CM

## 2020-05-28 ENCOUNTER — Other Ambulatory Visit: Payer: Self-pay | Admitting: Obstetrics and Gynecology

## 2020-05-28 DIAGNOSIS — G4709 Other insomnia: Secondary | ICD-10-CM

## 2020-06-07 ENCOUNTER — Other Ambulatory Visit: Payer: Self-pay

## 2020-06-07 ENCOUNTER — Ambulatory Visit
Admission: EM | Admit: 2020-06-07 | Discharge: 2020-06-07 | Disposition: A | Discharge status: Home / Self Care | Payer: Managed Care, Other (non HMO) | Attending: Emergency Medicine | Admitting: Emergency Medicine

## 2020-06-07 DIAGNOSIS — J029 Acute pharyngitis, unspecified: Secondary | ICD-10-CM | POA: Diagnosis present

## 2020-06-07 DIAGNOSIS — H9203 Otalgia, bilateral: Secondary | ICD-10-CM | POA: Diagnosis present

## 2020-06-07 DIAGNOSIS — J209 Acute bronchitis, unspecified: Secondary | ICD-10-CM | POA: Diagnosis present

## 2020-06-07 DIAGNOSIS — R05 Cough: Secondary | ICD-10-CM | POA: Diagnosis present

## 2020-06-07 DIAGNOSIS — R059 Cough, unspecified: Secondary | ICD-10-CM

## 2020-06-07 LAB — POCT RAPID STREP A (OFFICE): Rapid Strep A Screen: NEGATIVE

## 2020-06-07 MED ORDER — AZITHROMYCIN 250 MG PO TABS: 250.0000 mg | ORAL_TABLET | Freq: Every day | ORAL | 0 refills | Status: DC

## 2020-06-07 MED ORDER — BENZONATATE 100 MG PO CAPS: 100.0000 mg | ORAL_CAPSULE | Freq: Three times a day (TID) | ORAL | 0 refills | Status: DC | PRN

## 2020-06-07 NOTE — ED Provider Notes (Signed)
Angela Braun    CSN: [id number] Arrival date & time: 06/07/20  1208      History   Chief Complaint Chief Complaint  Patient presents with  . Sore Throat  . Otalgia    HPI Angela Braun is a 34 y.o. female.   Patient presents with 9-day history of wet-sounding cough, sore throat, bilateral earache.  She has had 2 negative COVID tests.  She denies fever, rash, shortness of breath, vomiting, diarrhea, or other symptoms.  She has been treating her symptoms with OTC cold medication.  Her medical history includes mitral valve prolapse, migraines, anxiety, small bowel obstruction, Crohn's disease.  The history is provided by the patient.    Past Medical History:  Diagnosis Date  . Abnormal Pap smear of cervix   . Crohn disease ([place])   . Migraine   . Mitral valve prolapse   . 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 Partial small bowel obstruction ([place]) 05/23/2019  . Generalized anxiety disorder 10/01/2018    Past Surgical History:  Procedure Laterality Date  . CESAREAN SECTION N/A 06/16/2018   Procedure: CESAREAN SECTION;  Surgeon: Will Bonnet, MD;  Location: [facility] ORS;  Service: Obstetrics;  Laterality: N/A;  . COLONOSCOPY    . COLONOSCOPY WITH PROPOFOL N/A 12/01/2019   Procedure: COLONOSCOPY WITH PROPOFOL;  Surgeon: Jonathon Bellows, MD;  Location: [facility] ENDOSCOPY;  Service: Gastroenterology;  Laterality: N/A;    OB History    Gravida  2   Para  2   Term  1   Preterm  1   AB      Living  3     SAB      TAB      Ectopic      Multiple  1   Live Births  3            Home Medications    Prior to Admission medications   Medication Sig Start Date End Date Taking? Authorizing Provider  FLUoxetine (PROZAC) 20 MG tablet TAKE 1 AND 1/2 TABLETS DAILY BY MOUTH 05/24/20  Yes Will Bonnet, MD  levonorgestrel (LILETTA, 52 MG,) 19.5  MCG/DAY IUD IUD 1 each by Intrauterine route once.   Yes [provider]  azithromycin (ZITHROMAX) 250 MG tablet Take 1 tablet (250 mg total) by mouth daily. Take first 2 tablets together, then 1 every day until finished. 06/07/20   Sharion Balloon, NP  benzonatate (TESSALON) 100 MG capsule Take 1 capsule (100 mg total) by mouth 3 (three) times daily as needed for cough. 06/07/20   Sharion Balloon, NP  traZODone (DESYREL) 50 MG tablet TAKE 1 TABLET (50 MG TOTAL) BY MOUTH AT BEDTIME AS NEEDED FOR SLEEP. 03/01/20   Will Bonnet, MD  traZODone (DESYREL) 50 MG tablet Take 1 tablet (50 mg total) by mouth at bedtime as needed for sleep. 12/06/19   Will Bonnet, MD    Family History Family History  Problem Relation Age of Onset  . Kidney disease Mother   . Cancer Father   . Ovarian cancer Maternal Aunt     Social History Social History   Tobacco Use  . Smoking status: Never Smoker  . Smokeless tobacco: Never Used  Vaping Use  . Vaping Use: Never used  Substance Use Topics  . Alcohol use: Yes    Comment: OCASSIONALLY  . Drug  use: No     Allergies   Penicillins and Latex   Review of Systems Review of Systems  Constitutional: Negative for chills and fever.  HENT: Positive for ear pain and sore throat.   Eyes: Negative for pain and visual disturbance.  Respiratory: Positive for cough. Negative for shortness of breath.   Cardiovascular: Negative for chest pain and palpitations.  Gastrointestinal: Negative for abdominal pain, diarrhea and vomiting.  Genitourinary: Negative for dysuria and hematuria.  Musculoskeletal: Negative for arthralgias and back pain.  Skin: Negative for color change and rash.  Neurological: Negative for seizures and syncope.  All other systems reviewed and are negative.    Physical Exam Triage Vital Signs ED Triage Vitals  Enc Vitals Group     BP 06/07/20 1229 115/83     Pulse Rate 06/07/20 1229 90     Resp 06/07/20 1229 14     Temp  06/07/20 1229 99.1 F (37.3 C)     Temp src --      SpO2 06/07/20 1229 98 %     Weight --      Height --      Head Circumference --      Peak Flow --      Pain Score 06/07/20 1228 3     Pain Loc --      Pain Edu? --      Excl. in [place]? --    No data found.  Updated Vital Signs BP 115/83   Pulse 90   Temp 99.1 F (37.3 C)   Resp 14   SpO2 98%   Breastfeeding No   Visual Acuity Right Eye Distance:   Left Eye Distance:   Bilateral Distance:    Right Eye Near:   Left Eye Near:    Bilateral Near:     Physical Exam Vitals and nursing note reviewed.  Constitutional:      General: She is not in acute distress.    Appearance: She is well-developed.  HENT:     Head: Normocephalic and atraumatic.     Right Ear: Tympanic membrane normal.     Left Ear: Tympanic membrane normal.     Nose: Nose normal.     Mouth/Throat:     Mouth: Mucous membranes are moist.     Pharynx: Posterior oropharyngeal erythema present. No oropharyngeal exudate.  Eyes:     Conjunctiva/sclera: Conjunctivae normal.  Cardiovascular:     Rate and Rhythm: Normal rate and regular rhythm.     Heart sounds: No murmur heard.   Pulmonary:     Effort: Pulmonary effort is normal. No respiratory distress.     Breath sounds: Normal breath sounds.  Abdominal:     Palpations: Abdomen is soft.     Tenderness: There is no abdominal tenderness. There is no guarding or rebound.  Musculoskeletal:     Cervical back: Neck supple.  Skin:    General: Skin is warm and dry.     Findings: No rash.  Neurological:     General: No focal deficit present.     Mental Status: She is alert and oriented to person, place, and time.     Gait: Gait normal.  Psychiatric:        Mood and Affect: Mood normal.        Behavior: Behavior normal.      UC Treatments / Results  Labs (all labs ordered are listed, but only abnormal results are displayed) Labs Reviewed  CULTURE, GROUP A STREP (  [facility])  POCT RAPID STREP A (OFFICE)     EKG   Radiology No results found.  Procedures Procedures (including critical care time)  Medications Ordered in UC Medications - No data to display  Initial Impression / Assessment and Plan / UC Course  I have reviewed the triage vital signs and the nursing notes.  Pertinent labs & imaging results that were available during my care of the patient were reviewed by me and considered in my medical decision making (see chart for details).   Acute bronchitis, sore throat, otalgia, cough.  Patient has been symptomatic for 9 days.  Rapid strep negative; culture pending.  Patient has already had 2 negative COVID tests.  Treating with Zithromax and Tessalon Perles.  Instructed patient to follow-up with her PCP if her symptoms are not improving.  Patient agrees to plan of care.   Final Clinical Impressions(s) / UC Diagnoses   Final diagnoses:  Acute bronchitis, unspecified organism  Sore throat  Otalgia of both ears  Cough     Discharge Instructions     Take the Zithromax and Tessalon Perles as directed.    Follow up with your primary care provider if your symptoms are not improving.        ED Prescriptions    Medication Sig Dispense Auth. Provider   azithromycin (ZITHROMAX) 250 MG tablet Take 1 tablet (250 mg total) by mouth daily. Take first 2 tablets together, then 1 every day until finished. 6 tablet Sharion Balloon, NP   benzonatate (TESSALON) 100 MG capsule Take 1 capsule (100 mg total) by mouth 3 (three) times daily as needed for cough. 21 capsule Sharion Balloon, NP     PDMP not reviewed this encounter.   Sharion Balloon, NP 06/07/20 1308

## 2020-06-07 NOTE — Discharge Instructions (Signed)
Take the Zithromax and Tessalon Perles as directed.    Follow up with your primary care provider if your symptoms are not improving.

## 2020-06-07 NOTE — ED Triage Notes (Signed)
Reports her children have been diagnosed with RSV. States she has sore throat, bilateral ear pain, and states it hurts worse to swallow. Patient reports two negative COVID tests within the last five days.

## 2020-06-09 LAB — CULTURE, GROUP A STREP (THRC)

## 2020-08-08 ENCOUNTER — Encounter: Payer: Self-pay | Admitting: Obstetrics and Gynecology

## 2020-08-08 ENCOUNTER — Other Ambulatory Visit: Payer: Self-pay

## 2020-08-08 ENCOUNTER — Ambulatory Visit (INDEPENDENT_AMBULATORY_CARE_PROVIDER_SITE_OTHER): Payer: Managed Care, Other (non HMO) | Admitting: Obstetrics and Gynecology

## 2020-08-08 VITALS — Ht 66.0 in | Wt 185.0 lb

## 2020-08-08 DIAGNOSIS — F411 Generalized anxiety disorder: Secondary | ICD-10-CM

## 2020-08-08 DIAGNOSIS — G4709 Other insomnia: Secondary | ICD-10-CM

## 2020-08-08 MED ORDER — TRAZODONE HCL 50 MG PO TABS: 150.0000 mg | ORAL_TABLET | Freq: Every evening | ORAL | 1 refills | Status: DC | PRN

## 2020-08-08 NOTE — Progress Notes (Signed)
   Virtual Visit via Telephone Note  I connected with Angela Braun on 08/08/20 at  4:10 PM EST by telephone and verified that I am speaking with the correct person using two identifiers.   I discussed the limitations, risks, security and privacy concerns of performing an evaluation and management service by telephone and the availability of in person appointments. I also discussed with the patient that there may be a patient responsible charge related to this service. The patient expressed understanding and agreed to proceed.  The patient was in car I spoke with the patient from my  office The names of people involved in this encounter were: Angela Braun and Prentice Docker, MD.   History of Present Illness: 34 y.o. [phone] female who presents in follow up for depression and insomnia. She states that she was doing well at first with the two medications.  She started taking trazodone daily and it stopped working as well.  Her prescription ran out.  She hasn't had a refill and she hasn't slept in a while. Now, she's adjusting going back to work.  She states that sleep seems to be the biggest factor.  She only ever took trazodone 70m at bedtime.  Her PHQ9 today is 14. Denies SI/HI   Observations/Objective: Physical Exam could not be performed. Because of the COVID-19 outbreak this visit was performed over the phone and not in person.   Assessment and Plan:   ICD-10-CM   1. Generalized anxiety disorder  F41.1   2. Other insomnia  G47.09 traZODone (DESYREL) 50 MG tablet   Increase trazodone dosing. Continue fluoxetine dose for now. If sleeping well and not improving, will increase prozac dose.    Follow Up Instructions: Follow up 3 months for medication check or sooner as needed   I discussed the assessment and treatment plan with the patient. The patient was provided an opportunity to ask questions and all were answered. The patient agreed with the plan and demonstrated an  understanding of the instructions.   The patient was advised to call back or seek an in-person evaluation if the symptoms worsen or if the condition fails to improve as anticipated.  I provided 21 minutes of non-face-to-face time during this encounter.  SPrentice Docker MD  [facility], [facility] 08/08/2020 5:12 PM

## 2020-09-08 ENCOUNTER — Ambulatory Visit: Payer: Self-pay

## 2020-11-07 ENCOUNTER — Other Ambulatory Visit: Payer: Self-pay

## 2020-11-07 ENCOUNTER — Encounter: Payer: Self-pay | Admitting: Obstetrics and Gynecology

## 2020-11-07 ENCOUNTER — Ambulatory Visit (INDEPENDENT_AMBULATORY_CARE_PROVIDER_SITE_OTHER): Payer: Managed Care, Other (non HMO) | Admitting: Obstetrics and Gynecology

## 2020-11-07 VITALS — BP 97/61 | Ht 67.0 in | Wt 186.0 lb

## 2020-11-07 DIAGNOSIS — G4709 Other insomnia: Secondary | ICD-10-CM | POA: Diagnosis not present

## 2020-11-07 DIAGNOSIS — F411 Generalized anxiety disorder: Secondary | ICD-10-CM | POA: Diagnosis not present

## 2020-11-07 MED ORDER — TRAZODONE HCL 50 MG PO TABS: 150.0000 mg | ORAL_TABLET | Freq: Every evening | ORAL | 2 refills | Status: DC | PRN

## 2020-11-07 MED ORDER — FLUOXETINE HCL 20 MG PO TABS: 40.0000 mg | ORAL_TABLET | Freq: Every day | ORAL | 1 refills | Status: DC

## 2020-11-07 NOTE — Progress Notes (Signed)
Obstetrics & Gynecology Office Visit   Chief Complaint  Patient presents with  . Follow-up   History of Present Illness: 35 y.o. G78P1103 female who presents in follow-up for depression and anxiety.  At her last visit the patient's Prozac dose was increased to 30 mg.  She also has been taking trazodone for sleep.  She states that she feels better than she did, though not exactly where she wants to be from a mood standpoint.  She is sleeping well with trazodone.  She denies suicidal and homicidal ideation today.   Past Medical History:  Diagnosis Date  . Abnormal Pap smear of cervix   . Crohn disease ([place])   . Migraine   . Mitral valve prolapse   . 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 CESAREAN SECTION N/A 06/16/2018   Procedure: CESAREAN SECTION;  Surgeon: Will Bonnet, MD;  Location: [facility] ORS;  Service: Obstetrics;  Laterality: N/A;  . COLONOSCOPY    . COLONOSCOPY WITH PROPOFOL N/A 12/01/2019   Procedure: COLONOSCOPY WITH PROPOFOL;  Surgeon: Jonathon Bellows, MD;  Location: [facility] ENDOSCOPY;  Service: Gastroenterology;  Laterality: N/A;    Gynecologic History: No LMP recorded. (Menstrual status: IUD).  Obstetric History: G2P1103  Family History  Problem Relation Age of Onset  . Kidney disease Mother   . Cancer Father   . Ovarian cancer Maternal Aunt     Social History   Socioeconomic History  . Marital status: Married    Spouse name: Not on file  . Number of children: Not on file  . Years of education: Not on file  . Highest education level: Not on file  Occupational History  . Not on file  Tobacco Use  . Smoking status: Never Smoker  . Smokeless tobacco: Never Used  Vaping Use  . Vaping Use: Never used  Substance and Sexual Activity  . Alcohol use: Yes    Comment: OCASSIONALLY  . Drug use: No  . Sexual activity: Yes    Birth control/protection: Implant  Other Topics Concern  . Not on file   Social History Narrative  . Not on file   Social Determinants of Health   Financial Resource Strain: Not on file  Food Insecurity: Not on file  Transportation Needs: Not on file  Physical Activity: Not on file  Stress: Not on file  Social Connections: Not on file  Intimate Partner Violence: Not on file    Allergies  Allergen Reactions  . Penicillins Hives  . Latex Rash    Prior to Admission medications   Medication Sig Start Date End Date Taking? Authorizing Provider  FLUoxetine (PROZAC) 20 MG tablet TAKE 1 AND 1/2 TABLETS DAILY BY MOUTH 05/24/20  Yes Will Bonnet, MD  levonorgestrel (LILETTA, 52 MG,) 19.5 MCG/DAY IUD IUD 1 each by Intrauterine route once.   Yes [provider]  traZODone (DESYREL) 50 MG tablet Take 3 tablets (150 mg total) by mouth at bedtime as needed for sleep. 08/08/20  Yes Will Bonnet, MD    Review of Systems  Constitutional: Negative.   HENT: Negative.   Eyes: Negative.   Respiratory: Negative.   Cardiovascular: Negative.   Gastrointestinal: Negative.   Genitourinary: Negative.   Musculoskeletal: Negative.   Skin: Negative.   Neurological: Negative.   Psychiatric/Behavioral: Positive for depression. Negative for hallucinations, memory loss, substance abuse and suicidal ideas. The patient is nervous/anxious. The patient does not have insomnia.  Physical Exam BP 97/61   Ht 5' 7"  (1.702 m)   Wt 186 lb (84.4 kg)   BMI 29.13 kg/m  No LMP recorded. (Menstrual status: IUD). Physical Exam Constitutional:      General: She is not in acute distress.    Appearance: Normal appearance.  HENT:     Head: Normocephalic and atraumatic.  Eyes:     General: No scleral icterus.    Conjunctiva/sclera: Conjunctivae normal.  Neurological:     General: No focal deficit present.     Mental Status: She is alert and oriented to person, place, and time.     Cranial Nerves: No cranial nerve deficit.  Psychiatric:        Mood and Affect:  Mood normal.        Behavior: Behavior normal.        Judgment: Judgment normal.    Female chaperone present for pelvic and breast  portions of the physical exam  PHQ-9: 9 GAD-7: 8  Assessment: 35 y.o. A1P3790 female here for  1. Generalized anxiety disorder   2. Other insomnia      Plan: Problem List Items Addressed This Visit      Other   Generalized anxiety disorder - Primary   Relevant Medications   traZODone (DESYREL) 50 MG tablet   FLUoxetine (PROZAC) 20 MG tablet    Other Visit Diagnoses    Other insomnia       Relevant Medications   traZODone (DESYREL) 50 MG tablet      Will increase Prozac to 40 mg daily from 30 mg.   Continue trazodone.  A total of 21 minutes were spent face-to-face with the patient as well as preparation, review, communication, and documentation during this encounter.    Return in about 3 months (around 02/07/2021) for Medication Follow up with Dr. Glennon Mac (can be phone or in-person).   Prentice Docker, MD 11/07/2020 1:33 PM

## 2020-11-20 ENCOUNTER — Other Ambulatory Visit: Payer: Self-pay | Admitting: Obstetrics and Gynecology

## 2020-11-20 DIAGNOSIS — F411 Generalized anxiety disorder: Secondary | ICD-10-CM

## 2020-12-19 ENCOUNTER — Other Ambulatory Visit: Payer: Self-pay | Admitting: Obstetrics and Gynecology

## 2020-12-19 DIAGNOSIS — F411 Generalized anxiety disorder: Secondary | ICD-10-CM

## 2021-07-20 ENCOUNTER — Other Ambulatory Visit: Payer: Self-pay | Admitting: Obstetrics and Gynecology

## 2021-07-20 DIAGNOSIS — F411 Generalized anxiety disorder: Secondary | ICD-10-CM

## 2021-09-25 ENCOUNTER — Encounter: Payer: Self-pay | Admitting: Adult Health

## 2021-09-25 ENCOUNTER — Ambulatory Visit: Payer: Managed Care, Other (non HMO) | Admitting: Adult Health

## 2021-09-25 ENCOUNTER — Ambulatory Visit (INDEPENDENT_AMBULATORY_CARE_PROVIDER_SITE_OTHER): Payer: Managed Care, Other (non HMO)

## 2021-09-25 ENCOUNTER — Other Ambulatory Visit: Payer: Self-pay

## 2021-09-25 VITALS — BP 118/82 | HR 77 | Resp 18 | Ht 67.32 in | Wt 192.8 lb

## 2021-09-25 DIAGNOSIS — R41840 Attention and concentration deficit: Secondary | ICD-10-CM

## 2021-09-25 DIAGNOSIS — R7612 Nonspecific reaction to cell mediated immunity measurement of gamma interferon antigen response without active tuberculosis: Secondary | ICD-10-CM | POA: Diagnosis not present

## 2021-09-25 DIAGNOSIS — F411 Generalized anxiety disorder: Secondary | ICD-10-CM | POA: Diagnosis not present

## 2021-09-25 DIAGNOSIS — E559 Vitamin D deficiency, unspecified: Secondary | ICD-10-CM | POA: Insufficient documentation

## 2021-09-25 DIAGNOSIS — Z13220 Encounter for screening for lipoid disorders: Secondary | ICD-10-CM

## 2021-09-25 DIAGNOSIS — Z131 Encounter for screening for diabetes mellitus: Secondary | ICD-10-CM

## 2021-09-25 LAB — COMPREHENSIVE METABOLIC PANEL
ALT: 13 U/L (ref 0–35)
AST: 12 U/L (ref 0–37)
Albumin: 4.2 g/dL (ref 3.5–5.2)
Alkaline Phosphatase: 46 U/L (ref 39–117)
BUN: 15 mg/dL (ref 6–23)
CO2: 30 mEq/L (ref 19–32)
Calcium: 9.1 mg/dL (ref 8.4–10.5)
Chloride: 102 mEq/L (ref 96–112)
Creatinine, Ser: 0.98 mg/dL (ref 0.40–1.20)
GFR: 74.53 mL/min (ref >60.00)
Glucose, Bld: 76 mg/dL (ref 70–99)
Potassium: 4.5 mEq/L (ref 3.5–5.1)
Sodium: 138 mEq/L (ref 135–145)
Total Bilirubin: 0.5 mg/dL (ref 0.2–1.2)
Total Protein: 6.6 g/dL (ref 6.0–8.3)

## 2021-09-25 LAB — CBC WITH DIFFERENTIAL/PLATELET
Basophils Absolute: 0 10*3/uL (ref 0.0–0.1)
Basophils Relative: 0.5 % (ref 0.0–3.0)
Eosinophils Absolute: 0.1 10*3/uL (ref 0.0–0.7)
Eosinophils Relative: 3.1 % (ref 0.0–5.0)
HCT: 38.4 % (ref 36.0–46.0)
Hemoglobin: 12.8 g/dL (ref 12.0–15.0)
Lymphocytes Relative: 27.4 % (ref 12.0–46.0)
Lymphs Abs: 1.2 10*3/uL (ref 0.7–4.0)
MCHC: 33.2 g/dL (ref 30.0–36.0)
MCV: 96.1 fl (ref 78.0–100.0)
Monocytes Absolute: 0.3 10*3/uL (ref 0.1–1.0)
Monocytes Relative: 6.5 % (ref 3.0–12.0)
Neutro Abs: 2.7 10*3/uL (ref 1.4–7.7)
Neutrophils Relative %: 62.5 % (ref 43.0–77.0)
Platelets: 201 10*3/uL (ref 150.0–400.0)
RBC: 3.99 Mil/uL (ref 3.87–5.11)
RDW: 12.4 % (ref 11.5–15.5)
WBC: 4.4 10*3/uL (ref 4.0–10.5)

## 2021-09-25 LAB — LIPID PANEL
Cholesterol: 151 mg/dL (ref 0–200)
HDL: 54.5 mg/dL (ref >39.00)
LDL Cholesterol: 76 mg/dL (ref 0–99)
NonHDL: 96.59
Total CHOL/HDL Ratio: 3
Triglycerides: 102 mg/dL (ref 0.0–149.0)
VLDL: 20.4 mg/dL (ref 0.0–40.0)

## 2021-09-25 LAB — TSH: TSH: 1.75 u[IU]/mL (ref 0.35–5.50)

## 2021-09-25 LAB — VITAMIN D 25 HYDROXY (VIT D DEFICIENCY, FRACTURES): VITD: 36.76 ng/mL (ref 30.00–100.00)

## 2021-09-25 NOTE — Assessment & Plan Note (Signed)
History reviewed quantiferon gold  reordered and chest x ray on 09/25/2021

## 2021-09-25 NOTE — Patient Instructions (Signed)

## 2021-09-25 NOTE — Progress Notes (Signed)
Chest x ray is within normal limits.

## 2021-09-25 NOTE — Assessment & Plan Note (Signed)
Checking levels today

## 2021-09-25 NOTE — Progress Notes (Signed)
New Patient Office Visit  Subjective:  Patient ID: Angela Braun, female    DOB: [birth year]  Age: 36 y.o. MRN: [id number]  CC:  Chief Complaint  Patient presents with   Transitions Of Care    HPI Angela Braun presents for establish care/ transfer of care.  She has been on Prozac 40 mg daily. and Trazodone taking 50-100 mg at night or 3 years for anxiety/ depression/ and post partum depression. Trazodone makes her feel very sleepy the next morning. Dr. Davis Gourd in psychiatry no longer at [facility]. No history of panic attacks.  She was off her medications when pregnant with twins in 2019 and noticed no difference with the medications and without.  History of learning disabilities.  She feels like she has more difficulty concentration, hard getting tasks done.  She would like to have ADHD testing.  Seen Dr. Bailey Mech - 12/01/19 colonoscopy-     Denies suicidal or homicidal ideations or intents.  PAP updated with Dr Barnabas Harries.   Patient  denies any fever, body aches,chills, rash, chest pain, shortness of breath, nausea, vomiting, or diarrhea.  Denies dizziness, lightheadedness, pre syncopal or syncopal episodes.      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    Past Surgical History:  Procedure Laterality Date   CESAREAN SECTION N/A 06/16/2018   Procedure: CESAREAN SECTION;  Surgeon: Will Bonnet, MD;  Location: [facility] ORS;  Service: Obstetrics;  Laterality: N/A;   COLONOSCOPY     COLONOSCOPY WITH PROPOFOL N/A 12/01/2019   Procedure: COLONOSCOPY WITH PROPOFOL;  Surgeon: Jonathon Bellows, MD;  Location: [facility] ENDOSCOPY;  Service: Gastroenterology;  Laterality: N/A;    Family History  Problem Relation Age of Onset   Kidney disease Mother    Cancer Father    Ovarian cancer Maternal Aunt     Social History   Socioeconomic History    Marital status: Married    Spouse name: Not on file   Number of children: Not on file   Years of education: Not on file   Highest education level: Not on file  Occupational History   Not on file  Tobacco Use   Smoking status: Never   Smokeless tobacco: Never  Vaping Use   Vaping Use: Never used  Substance and Sexual Activity   Alcohol use: Yes    Comment: OCASSIONALLY   Drug use: No   Sexual activity: Yes    Birth control/protection: Implant  Other Topics Concern   Not on file  Social History Narrative   Not on file   Social Determinants of Health   Financial Resource Strain: Not on file  Food Insecurity: Not on file  Transportation Needs: Not on file  Physical Activity: Not on file  Stress: Not on file  Social Connections: Not on file  Intimate Partner Violence: Not on file    ROS Review of Systems  Constitutional: Negative.   HENT: Negative.    Respiratory: Negative.    Cardiovascular: Negative.   Gastrointestinal: Negative.   Genitourinary: Negative.   Musculoskeletal: Negative.   Skin: Negative.   Neurological: Negative.   Psychiatric/Behavioral:  Positive for decreased concentration. Negative for agitation, behavioral problems, confusion, dysphoric mood, hallucinations, self-injury, sleep disturbance and suicidal ideas. The patient is nervous/anxious and is hyperactive.    Objective:   Today's Vitals:  BP 118/82    Pulse 77    Resp 18    Ht 5' 7.32" (1.71 m)    Wt 192 lb 12.8 oz (87.5 kg)    SpO2 97%    BMI 29.91 kg/m   Physical Exam  General: Appearance:     Overweight female in no acute distress  Eyes:    PERRL, conjunctiva/corneas clear, EOM's intact       Lungs:     Clear to auscultation bilaterally, respirations unlabored  Heart:    Normal heart rate. Normal rhythm. No murmurs, rubs, or gallops.    MS:   All extremities are intact.    Neurologic:   Awake, alert, oriented x 3. No apparent focal neurological           defect.     Assessment &  Plan:   Problem List Items Addressed This Visit       Other   Generalized anxiety disorder - Primary   Relevant Orders   CBC with Differential/Platelet   Comprehensive metabolic panel   TSH   Ambulatory referral to Psychiatry   Positive QuantiFERON-TB Gold test    History reviewed quantiferon gold  reordered and chest x ray on 09/25/2021      Relevant Orders   QuantiFERON-TB Gold Plus   DG Chest 2 View   Vitamin D deficiency    Checking levels today       Relevant Orders   VITAMIN D 25 Hydroxy (Vit-D Deficiency, Fractures)   Screening for diabetes mellitus   Relevant Orders   Hgb A1c w/o eAG   Difficulty concentrating    Referred to [place] attention specialist for ADHD testing. If negative need to readdress medications.       Relevant Orders   Ambulatory referral to Psychiatry  Call if not heard from referral within 2 week.   Outpatient Encounter Medications as of 09/25/2021  Medication Sig   FLUoxetine (PROZAC) 20 MG tablet TAKE 2 TABLETS (40 MG TOTAL) BY MOUTH DAILY.   levonorgestrel (LILETTA, 52 MG,) 19.5 MCG/DAY IUD IUD 1 each by Intrauterine route once.   traZODone (DESYREL) 50 MG tablet Take 3 tablets (150 mg total) by mouth at bedtime as needed for sleep.   No facility-administered encounter medications on file as of 09/25/2021.   Labs ordered and need CPE in near future.  Red Flags discussed. The patient was given clear instructions to go to ER or return to medical center if any red flags develop, symptoms do not improve, worsen or new problems develop. They verbalized understanding.  Return in about 1 month (around 10/26/2021), or if symptoms worsen or fail to improve, for at any time for any worsening symptoms, Go to Emergency room/ urgent care if worse.  Follow-up: Return in about 1 month (around 10/26/2021), or if symptoms worsen or fail to improve, for at any time for any worsening symptoms, Go to Emergency room/ urgent care if worse.   Angela Buffy, FNP

## 2021-09-25 NOTE — Assessment & Plan Note (Signed)
Referred to [place] attention specialist for ADHD testing. If negative need to readdress medications.

## 2021-09-26 ENCOUNTER — Other Ambulatory Visit: Payer: Self-pay | Admitting: Adult Health

## 2021-09-26 DIAGNOSIS — E559 Vitamin D deficiency, unspecified: Secondary | ICD-10-CM

## 2021-09-26 LAB — HGB A1C W/O EAG: Hgb A1c MFr Bld: 5.1 % (ref 4.8–5.6)

## 2021-09-26 MED ORDER — VITAMIN D (ERGOCALCIFEROL) 1.25 MG (50000 UNIT) PO CAPS: 50000.0000 [IU] | ORAL_CAPSULE | ORAL | 0 refills | Status: DC

## 2021-09-26 NOTE — Progress Notes (Signed)
Hemoglobin A1C is non diabetic. CBC and CMP is within normal limits. TSH for thyroid within normal limits.  Lipid panel for cholesterol is within normal limits.  Vitamin  D is low end normal this can contribute to poor sleep and fatigue, will send in prescription for Vitamin D at 50,000 units by mouth once every 7 days/(once weekly) for 12 weeks. Advise recheck lab Vitamin D in 1-2 weeks after completing vitamin d prescription. Labs need to be scheduled. Please add vitamin D lab for recheck.

## 2021-09-29 LAB — QUANTIFERON-TB GOLD PLUS
Mitogen-NIL: >10 IU/mL
NIL: 0.03 IU/mL
QuantiFERON-TB Gold Plus: NEGATIVE
TB1-NIL: <0 IU/mL
TB2-NIL: 0 IU/mL

## 2021-10-01 ENCOUNTER — Encounter: Payer: Self-pay | Admitting: Adult Health

## 2021-10-02 ENCOUNTER — Encounter: Payer: Self-pay | Admitting: *Deleted

## 2021-10-02 ENCOUNTER — Telehealth: Payer: Self-pay | Admitting: Adult Health

## 2021-10-02 ENCOUNTER — Ambulatory Visit: Payer: Managed Care, Other (non HMO) | Admitting: Adult Health

## 2021-10-02 ENCOUNTER — Other Ambulatory Visit: Payer: Self-pay | Admitting: Adult Health

## 2021-10-02 DIAGNOSIS — E559 Vitamin D deficiency, unspecified: Secondary | ICD-10-CM

## 2021-10-02 NOTE — Telephone Encounter (Signed)
Patient is requesting labs to check victim D no lab orders.

## 2021-10-02 NOTE — Telephone Encounter (Signed)
Vitamin D lab added should be checked after completing the vitamin D 12 week prescription not started until last week. Due in 3 months approximately. She can schedule.

## 2021-10-02 NOTE — Progress Notes (Signed)
Orders Placed This Encounter  Procedures   VITAMIN D 25 Hydroxy (Vit-D Deficiency, Fractures)    Standing Status:   Future    Standing Expiration Date:   10/02/2022

## 2021-10-02 NOTE — Telephone Encounter (Signed)
My chart sent to schedule Lab.

## 2021-10-08 ENCOUNTER — Ambulatory Visit: Payer: Managed Care, Other (non HMO) | Admitting: Adult Health

## 2021-12-04 ENCOUNTER — Ambulatory Visit: Payer: Managed Care, Other (non HMO)

## 2021-12-23 ENCOUNTER — Other Ambulatory Visit: Payer: Self-pay

## 2022-02-04 ENCOUNTER — Other Ambulatory Visit: Payer: Self-pay

## 2022-09-11 ENCOUNTER — Observation Stay
Admission: EM | Admit: 2022-09-11 | Discharge: 2022-09-12 | Disposition: A | Discharge status: Home / Self Care | Payer: Managed Care, Other (non HMO) | Attending: Surgery | Admitting: Surgery

## 2022-09-11 ENCOUNTER — Encounter: Payer: Self-pay | Admitting: *Deleted

## 2022-09-11 ENCOUNTER — Emergency Department: Payer: Managed Care, Other (non HMO)

## 2022-09-11 ENCOUNTER — Other Ambulatory Visit: Payer: Self-pay

## 2022-09-11 DIAGNOSIS — K8012 Calculus of gallbladder with acute and chronic cholecystitis without obstruction: Principal | ICD-10-CM | POA: Insufficient documentation

## 2022-09-11 DIAGNOSIS — Z91040 Latex allergy status: Secondary | ICD-10-CM | POA: Insufficient documentation

## 2022-09-11 DIAGNOSIS — K810 Acute cholecystitis: Secondary | ICD-10-CM | POA: Diagnosis not present

## 2022-09-11 DIAGNOSIS — R1011 Right upper quadrant pain: Secondary | ICD-10-CM | POA: Diagnosis present

## 2022-09-11 LAB — COMPREHENSIVE METABOLIC PANEL
ALT: 22 U/L (ref 0–44)
AST: 20 U/L (ref 15–41)
Albumin: 4.1 g/dL (ref 3.5–5.0)
Alkaline Phosphatase: 44 U/L (ref 38–126)
Anion gap: 9 (ref 5–15)
BUN: 16 mg/dL (ref 6–20)
CO2: 21 mmol/L — ABNORMAL LOW (ref 22–32)
Calcium: 8.9 mg/dL (ref 8.9–10.3)
Chloride: 107 mmol/L (ref 98–111)
Creatinine, Ser: 0.92 mg/dL (ref 0.44–1.00)
GFR, Estimated: >60 mL/min (ref >60)
Glucose, Bld: 102 mg/dL — ABNORMAL HIGH (ref 70–99)
Potassium: 3.7 mmol/L (ref 3.5–5.1)
Sodium: 137 mmol/L (ref 135–145)
Total Bilirubin: 0.8 mg/dL (ref 0.3–1.2)
Total Protein: 6.9 g/dL (ref 6.5–8.1)

## 2022-09-11 LAB — CBC
HCT: 39.7 % (ref 36.0–46.0)
Hemoglobin: 13.3 g/dL (ref 12.0–15.0)
MCH: 32.1 pg (ref 26.0–34.0)
MCHC: 33.5 g/dL (ref 30.0–36.0)
MCV: 95.9 fL (ref 80.0–100.0)
Platelets: 252 K/uL (ref 150–400)
RBC: 4.14 MIL/uL (ref 3.87–5.11)
RDW: 12.2 % (ref 11.5–15.5)
WBC: 6.1 K/uL (ref 4.0–10.5)
nRBC: 0 % (ref 0.0–0.2)

## 2022-09-11 LAB — URINALYSIS, ROUTINE W REFLEX MICROSCOPIC
Bilirubin Urine: NEGATIVE
Glucose, UA: NEGATIVE mg/dL
Hgb urine dipstick: NEGATIVE
Ketones, ur: NEGATIVE mg/dL
Leukocytes,Ua: NEGATIVE
Nitrite: NEGATIVE
Protein, ur: NEGATIVE mg/dL
Specific Gravity, Urine: 1.012 (ref 1.005–1.030)
pH: 7 (ref 5.0–8.0)

## 2022-09-11 LAB — LIPASE, BLOOD: Lipase: 37 U/L (ref 11–51)

## 2022-09-11 MED ORDER — ONDANSETRON HCL 4 MG/2ML IJ SOLN: 4.0000 mg | Freq: Four times a day (QID) | INTRAMUSCULAR | Status: DC | PRN

## 2022-09-11 MED ORDER — FENTANYL CITRATE PF 50 MCG/ML IJ SOSY
50.0000 ug | PREFILLED_SYRINGE | Freq: Once | INTRAMUSCULAR | Status: AC
  Administered 2022-09-11: 50 ug via INTRAVENOUS
  Filled 2022-09-11: qty 1

## 2022-09-11 MED ORDER — ONDANSETRON HCL 4 MG/2ML IJ SOLN
4.0000 mg | Freq: Once | INTRAMUSCULAR | Status: AC
  Administered 2022-09-11: 4 mg via INTRAVENOUS
  Filled 2022-09-11: qty 2

## 2022-09-11 MED ORDER — SODIUM CHLORIDE 0.9 % IV BOLUS
1000.0000 mL | Freq: Once | INTRAVENOUS | Status: AC
  Administered 2022-09-11: 1000 mL via INTRAVENOUS

## 2022-09-11 MED ORDER — DIPHENHYDRAMINE HCL 12.5 MG/5ML PO ELIX: 12.5000 mg | ORAL_SOLUTION | Freq: Four times a day (QID) | ORAL | Status: DC | PRN

## 2022-09-11 MED ORDER — ENOXAPARIN SODIUM 40 MG/0.4ML IJ SOSY
40.0000 mg | PREFILLED_SYRINGE | INTRAMUSCULAR | Status: DC
  Administered 2022-09-11: 40 mg via SUBCUTANEOUS
  Filled 2022-09-11: qty 0.4

## 2022-09-11 MED ORDER — ONDANSETRON 4 MG PO TBDP: 4.0000 mg | ORAL_TABLET | Freq: Four times a day (QID) | ORAL | Status: DC | PRN

## 2022-09-11 MED ORDER — PROCHLORPERAZINE MALEATE 10 MG PO TABS: 10.0000 mg | ORAL_TABLET | Freq: Four times a day (QID) | ORAL | Status: DC | PRN

## 2022-09-11 MED ORDER — DIPHENHYDRAMINE HCL 50 MG/ML IJ SOLN: 12.5000 mg | Freq: Four times a day (QID) | INTRAMUSCULAR | Status: DC | PRN

## 2022-09-11 MED ORDER — MORPHINE SULFATE (PF) 4 MG/ML IV SOLN: 4.0000 mg | INTRAVENOUS | Status: DC | PRN

## 2022-09-11 MED ORDER — OXYCODONE HCL 5 MG PO TABS: 5.0000 mg | ORAL_TABLET | ORAL | Status: DC | PRN

## 2022-09-11 MED ORDER — TRAZODONE HCL 50 MG PO TABS: 150.0000 mg | ORAL_TABLET | Freq: Every evening | ORAL | Status: DC | PRN

## 2022-09-11 MED ORDER — ACETAMINOPHEN 500 MG PO TABS
1000.0000 mg | ORAL_TABLET | Freq: Four times a day (QID) | ORAL | Status: DC
  Administered 2022-09-11: 1000 mg via ORAL
  Filled 2022-09-11: qty 2

## 2022-09-11 MED ORDER — PROCHLORPERAZINE EDISYLATE 10 MG/2ML IJ SOLN
5.0000 mg | Freq: Four times a day (QID) | INTRAMUSCULAR | Status: DC | PRN
  Administered 2022-09-12: 5 mg via INTRAVENOUS
  Filled 2022-09-11: qty 2

## 2022-09-11 MED ORDER — SODIUM CHLORIDE 0.9 % IV SOLN: INTRAVENOUS | Status: DC

## 2022-09-11 MED ORDER — SODIUM CHLORIDE 0.9 % IV SOLN
2.0000 g | INTRAVENOUS | Status: DC
  Administered 2022-09-11: 2 g via INTRAVENOUS
  Filled 2022-09-11: qty 20

## 2022-09-11 MED ORDER — PANTOPRAZOLE SODIUM 40 MG IV SOLR
40.0000 mg | Freq: Every day | INTRAVENOUS | Status: DC
  Administered 2022-09-11: 40 mg via INTRAVENOUS
  Filled 2022-09-11: qty 10

## 2022-09-11 MED ORDER — INDOCYANINE GREEN 25 MG IV SOLR
2.5000 mg | Freq: Once | INTRAVENOUS | Status: AC
  Administered 2022-09-12: 2.5 mg via INTRAVENOUS
  Filled 2022-09-11: qty 1

## 2022-09-11 MED ORDER — KETOROLAC TROMETHAMINE 30 MG/ML IJ SOLN: 30.0000 mg | Freq: Four times a day (QID) | INTRAMUSCULAR | Status: DC | PRN

## 2022-09-11 MED ORDER — FLUOXETINE HCL 20 MG PO CAPS: 40.0000 mg | ORAL_CAPSULE | Freq: Every day | ORAL | Status: DC

## 2022-09-11 NOTE — ED Notes (Signed)
See triage note. Pt reports HA, N/V/D and moderate dull medial abdominal pain since Christmas. Denies any at-home tests, SOB, CP, or changes to urination. Pt's resp reg/unlabored, skin dry and sitting calmly on stretcher.

## 2022-09-11 NOTE — ED Triage Notes (Signed)
Pt has abd pain since 12/25.  Intermittent vomiting and diarrhea.  Pt also has stomach cramps.  No vag bleeding.  No dysuria.  Pt alert.

## 2022-09-11 NOTE — ED Notes (Signed)
Poct pregnancy Negative 

## 2022-09-11 NOTE — ED Provider Notes (Signed)
[facility] Provider Note    Event Date/Time   First MD Initiated Contact with Patient 09/11/22 1753     (approximate)   History   Abdominal Pain   HPI  Angela Braun is a 37 y.o. female with history of partial small bowel obstruction, Crohn's, and as listed in the EMR presents to the emergency department for treatment and evaluation of right upper quadrant pain that started approximately 10 days ago.  Pain is associated with nausea.  No known fever.       Physical Exam   Triage Vital Signs: ED Triage Vitals  Enc Vitals Group     BP 09/11/22 1708 123/85     Pulse Rate 09/11/22 1708 88     Resp 09/11/22 1713 18     Temp 09/11/22 1708 98.3 F (36.8 C)     Temp Source 09/11/22 1708 Oral     SpO2 09/11/22 1708 98 %     Weight 09/11/22 1705 178 lb (80.7 kg)     Height 09/11/22 1705 5\' 7"  (1.702 m)     Head Circumference --      Peak Flow --      Pain Score 09/11/22 1705 5     Pain Loc --      Pain Edu? --      Excl. in [place]? --     Most recent vital signs: Vitals:   09/11/22 1713 09/11/22 2038  BP:  106/72  Pulse:  82  Resp: 18 20  Temp:  98.3 F (36.8 C)  SpO2:  100%     General: Awake, no distress.  CV:  Good peripheral perfusion.  Resp:  Normal effort.  Abd:  No distention. RUQ tender to palpation Other:     ED Results / Procedures / Treatments   Labs (all labs ordered are listed, but only abnormal results are displayed) Labs Reviewed  COMPREHENSIVE METABOLIC PANEL - Abnormal; Notable for the following components:      Result Value   CO2 21 (*)    Glucose, Bld 102 (*)    All other components within normal limits  URINALYSIS, ROUTINE W REFLEX MICROSCOPIC - Abnormal; Notable for the following components:   Color, Urine YELLOW (*)    APPearance CLEAR (*)    All other components within normal limits  LIPASE, BLOOD  CBC  HIV ANTIBODY (ROUTINE TESTING W REFLEX)  CREATININE, SERUM  POC URINE PREG, ED      EKG     RADIOLOGY    PROCEDURES:  Critical Care performed: No  Procedures   MEDICATIONS ORDERED IN ED: Medications  traZODone (DESYREL) tablet 150 mg (has no administration in time range)  FLUoxetine (PROZAC) capsule 40 mg (has no administration in time range)  enoxaparin (LOVENOX) injection 40 mg (40 mg Subcutaneous Given 09/11/22 2304)  0.9 %  sodium chloride infusion ( Intravenous New Bag/Given 09/11/22 2256)  cefTRIAXone (ROCEPHIN) 2 g in sodium chloride 0.9 % 100 mL IVPB (2 g Intravenous New Bag/Given 09/11/22 2300)  acetaminophen (TYLENOL) tablet 1,000 mg (1,000 mg Oral Given 09/11/22 2303)  ketorolac (TORADOL) 30 MG/ML injection 30 mg (has no administration in time range)  oxyCODONE (Oxy IR/ROXICODONE) immediate release tablet 5-10 mg (has no administration in time range)  diphenhydrAMINE (BENADRYL) 12.5 MG/5ML elixir 12.5 mg (has no administration in time range)    Or  diphenhydrAMINE (BENADRYL) injection 12.5 mg (has no administration in time range)  morphine (PF) 4 MG/ML injection 4 mg (has no  administration in time range)  ondansetron (ZOFRAN-ODT) disintegrating tablet 4 mg (has no administration in time range)    Or  ondansetron (ZOFRAN) injection 4 mg (has no administration in time range)  prochlorperazine (COMPAZINE) tablet 10 mg (has no administration in time range)    Or  prochlorperazine (COMPAZINE) injection 5-10 mg (has no administration in time range)  pantoprazole (PROTONIX) injection 40 mg (40 mg Intravenous Given 09/11/22 2256)  indocyanine green (IC-GREEN) injection 2.5 mg (has no administration in time range)  sodium chloride 0.9 % bolus 1,000 mL (0 mLs Intravenous Stopped 09/11/22 2013)  ondansetron (ZOFRAN) injection 4 mg (4 mg Intravenous Given 09/11/22 1833)  fentaNYL (SUBLIMAZE) injection 50 mcg (50 mcg Intravenous Given 09/11/22 2030)  ondansetron (ZOFRAN) injection 4 mg (4 mg Intravenous Given 09/11/22 2028)     IMPRESSION / MDM / ASSESSMENT AND PLAN  / ED COURSE  I reviewed the triage vital signs and the nursing notes.                              Differential diagnosis includes, but is not limited to, acute cholecystitis, cholelithiasis, appendicitis, IBS, intra-abdominal infection, small bowel obstruction, colitis  Patient's presentation is most consistent with acute presentation with potential threat to life or bodily function.  37 year old female presenting to the emergency department for treatment and evaluation of persistent abdominal pain.  See HPI for further details.  On exam, she is exquisitely tender in the right upper quadrant.  Symptoms are concerning for acute cholecystitis.  Fluids, nausea meds, and ultrasound ordered.  Pain medication offered but patient would like to have nausea medicine first.   Clinical Course as of 09/11/22 2317  Thu Sep 11, 2022  1935 General surgery paged for consult of acute cholecystitis. [CT]  1950 Results discussed with patient. She last ate at 1:30 this afternoon. Dr. Dahlia Byes will see patient.  [CT]    Clinical Course User Index [CT] Cera Rorke B, FNP     FINAL CLINICAL IMPRESSION(S) / ED DIAGNOSES   Final diagnoses:  Acute cholecystitis     Rx / DC Orders   ED Discharge Orders     None        Note:  This document was prepared using Dragon voice recognition software and may include unintentional dictation errors.   Victorino Dike, FNP 09/11/22 2317    Rada Hay, MD 09/12/22 [phone]

## 2022-09-11 NOTE — H&P (Signed)
Patient ID: Angela Braun, female   DOB: June 09, 1986, 37 y.o.   MRN: [id number]  HPI Angela Braun is a 37 y.o. female seen in consultation at the request of Ms. Triplett NP. She reports that she started having abdominal pain on Christmas day.  Pain has been intermittent for the last couple of days has been crescendo and severe located in the epigastric area and right upper quadrant it is sharp.  There are no specific alleviating or aggravating factors.  Patient specifically denies jaundice or biliary obstruction. She did have a prior history of small bowel obstructions.  Apparently she does have a diagnosis of Crohn's disease at age 55.  He was seen by Dr. Vicente Males and an MRI was performed showing no evidence of active disease.  Did have a normal colonoscopy about 2 and half years ago. Was recently she underwent in the ER ultrasound that I have personally reviewed showing evidence of gallstones with normal common bile duct.  CBC and CMP is completely normal. 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    Past Surgical History:  Procedure Laterality Date   CESAREAN SECTION N/A 06/16/2018   Procedure: CESAREAN SECTION;  Surgeon: Will Bonnet, MD;  Location: [facility] ORS;  Service: Obstetrics;  Laterality: N/A;   COLONOSCOPY     COLONOSCOPY WITH PROPOFOL N/A 12/01/2019   Procedure: COLONOSCOPY WITH PROPOFOL;  Surgeon: Jonathon Bellows, MD;  Location: [facility] ENDOSCOPY;  Service: Gastroenterology;  Laterality: N/A;    Family History  Problem Relation Age of Onset   Kidney disease Mother    Cancer Father    Ovarian cancer Maternal Aunt     Social History Social History   Tobacco Use   Smoking status: Never   Smokeless tobacco: Never  Vaping Use   Vaping Use: Never used  Substance Use Topics   Alcohol use: Yes    Comment: OCASSIONALLY   Drug use: No     Allergies  Allergen Reactions   Penicillins Hives   Latex Rash    Current Facility-Administered Medications  Medication Dose Route Frequency Provider Last Rate Last Admin   indocyanine green (IC-GREEN) injection 2.5 mg  2.5 mg Intravenous Once Terina Mcelhinny F, MD       Current Outpatient Medications  Medication Sig Dispense Refill   FLUoxetine (PROZAC) 20 MG tablet TAKE 2 TABLETS (40 MG TOTAL) BY MOUTH DAILY. 180 tablet 1   levonorgestrel (LILETTA, 52 MG,) 19.5 MCG/DAY IUD IUD 1 each by Intrauterine route once.     traZODone (DESYREL) 50 MG tablet Take 3 tablets (150 mg total) by mouth at bedtime as needed for sleep. 270 tablet 2   Vitamin D, Ergocalciferol, (DRISDOL) 1.25 MG (50000 UNIT) CAPS capsule Take 1 capsule (50,000 Units total) by mouth every 7 (seven) days. (taking one tablet per week) lab  in office schedule 1-2 weeks after completing prescription. 12 capsule 0     Review of Systems Full ROS  was asked and was negative except for the information on the HPI  Physical Exam Blood pressure 106/72, pulse 82, temperature 98.3 F (36.8 C), resp. rate 20, height 5\' 7"  (1.702 m), weight 80.7 kg, SpO2 100 %. CONSTITUTIONAL: NAD. EYES: Pupils are equal, round, Sclera are non-icteric. EARS, NOSE, MOUTH AND THROAT: The oropharynx is clear. The oral mucosa is pink  and moist. Hearing is intact to voice. LYMPH NODES:  Lymph nodes in the neck are normal. RESPIRATORY:  Lungs are clear. There is normal respiratory effort, with equal breath sounds bilaterally, and without pathologic use of accessory muscles. CARDIOVASCULAR: Heart is regular without murmurs, gallops, or rubs. GI: The abdomen is  soft, tender to palpation in the right upper quadrant with positive Murphy sign.  There is no evidence of peritonitis.  There is no evidence of hernias or any other scars GU: Rectal deferred.   MUSCULOSKELETAL: Normal muscle strength and tone. No cyanosis or edema.   SKIN: Turgor is good and  there are no pathologic skin lesions or ulcers. NEUROLOGIC: Motor and sensation is grossly normal. Cranial nerves are grossly intact. PSYCH:  Oriented to person, place and time. Affect is normal.  Data Reviewed  I have personally reviewed the patient's imaging, laboratory findings and medical records.    Assessment/Plan 37 year old female with crescendo symptoms of biliary colic and now acute cholecystitis.  Discussed with the patient in detail about her disease process I definitely recommend admission to the hospital and prompt cholecystectomy.  Will plan to perform cholecystectomy tomorrow.  Will start antibiotic therapy, fluids and keep the patient n.p.o. after midnight.  She does not need an emergency operation at this time  The risks, benefits, complications, treatment options, and expected outcomes were discussed with the patient. The possibilities of bleeding, recurrent infection, finding a normal gallbladder, perforation of viscus organs, damage to surrounding structures, bile leak, abscess formation, needing a drain placed, the need for additional procedures, reaction to medication, pulmonary aspiration,  failure to diagnose a condition, the possible need to convert to an open procedure, and creating a complication requiring transfusion or operation were discussed with the patient. The patient and/or family concurred with the proposed plan, giving informed consent.   Please note that I spent 75 minutes in this encounter including personally reviewing imaging studies, coordinating her care, counseling the patient, placing orders and performing appropriate documentation.    Caroleen Hamman, MD FACS General Surgeon 09/11/2022, 9:57 PM

## 2022-09-12 ENCOUNTER — Other Ambulatory Visit: Payer: Self-pay

## 2022-09-12 ENCOUNTER — Observation Stay: Payer: Managed Care, Other (non HMO) | Admitting: Anesthesiology

## 2022-09-12 ENCOUNTER — Encounter
Admission: EM | Disposition: A | Discharge status: Home / Self Care | Payer: Self-pay | Source: Home / Self Care | Attending: Emergency Medicine

## 2022-09-12 ENCOUNTER — Encounter: Payer: Self-pay | Admitting: Surgery

## 2022-09-12 DIAGNOSIS — K810 Acute cholecystitis: Secondary | ICD-10-CM | POA: Diagnosis not present

## 2022-09-12 LAB — CBC
HCT: 34.8 % — ABNORMAL LOW (ref 36.0–46.0)
Hemoglobin: 11.3 g/dL — ABNORMAL LOW (ref 12.0–15.0)
MCH: 31.6 pg (ref 26.0–34.0)
MCHC: 32.5 g/dL (ref 30.0–36.0)
MCV: 97.2 fL (ref 80.0–100.0)
Platelets: 205 10*3/uL (ref 150–400)
RBC: 3.58 MIL/uL — ABNORMAL LOW (ref 3.87–5.11)
RDW: 11.9 % (ref 11.5–15.5)
WBC: 5.8 10*3/uL (ref 4.0–10.5)
nRBC: 0 % (ref 0.0–0.2)

## 2022-09-12 LAB — CREATININE, SERUM
Creatinine, Ser: 0.77 mg/dL (ref 0.44–1.00)
GFR, Estimated: >60 mL/min (ref >60)

## 2022-09-12 LAB — POC URINE PREG, ED: Preg Test, Ur: NEGATIVE

## 2022-09-12 LAB — HIV ANTIBODY (ROUTINE TESTING W REFLEX): HIV Screen 4th Generation wRfx: NONREACTIVE

## 2022-09-12 SURGERY — CHOLECYSTECTOMY, ROBOT-ASSISTED, LAPAROSCOPIC
Anesthesia: General | Site: Abdomen

## 2022-09-12 MED ORDER — LIDOCAINE HCL (CARDIAC) PF 100 MG/5ML IV SOSY
PREFILLED_SYRINGE | INTRAVENOUS | Status: DC | PRN
  Administered 2022-09-12: 80 mg via INTRAVENOUS

## 2022-09-12 MED ORDER — DEXAMETHASONE SODIUM PHOSPHATE 10 MG/ML IJ SOLN
INTRAMUSCULAR | Status: DC | PRN
  Administered 2022-09-12: 10 mg via INTRAVENOUS

## 2022-09-12 MED ORDER — FENTANYL CITRATE (PF) 100 MCG/2ML IJ SOLN
INTRAMUSCULAR | Status: AC
  Filled 2022-09-12: qty 2

## 2022-09-12 MED ORDER — ACETAMINOPHEN 10 MG/ML IV SOLN
1000.0000 mg | Freq: Once | INTRAVENOUS | Status: DC | PRN
  Administered 2022-09-12: 1000 mg via INTRAVENOUS

## 2022-09-12 MED ORDER — MUPIROCIN 2 % EX OINT
1.0000 | TOPICAL_OINTMENT | Freq: Two times a day (BID) | CUTANEOUS | Status: DC
  Filled 2022-09-12: qty 22

## 2022-09-12 MED ORDER — EPHEDRINE SULFATE (PRESSORS) 50 MG/ML IJ SOLN
INTRAMUSCULAR | Status: DC | PRN
  Administered 2022-09-12: 10 mg via INTRAVENOUS
  Administered 2022-09-12: 5 mg via INTRAVENOUS
  Administered 2022-09-12: 10 mg via INTRAVENOUS

## 2022-09-12 MED ORDER — LACTATED RINGERS IV SOLN: INTRAVENOUS | Status: DC

## 2022-09-12 MED ORDER — OXYCODONE HCL 5 MG/5ML PO SOLN: 5.0000 mg | Freq: Once | ORAL | Status: AC | PRN

## 2022-09-12 MED ORDER — ONDANSETRON HCL 4 MG/2ML IJ SOLN
INTRAMUSCULAR | Status: DC | PRN
  Administered 2022-09-12: 4 mg via INTRAVENOUS

## 2022-09-12 MED ORDER — ACETAMINOPHEN 10 MG/ML IV SOLN
INTRAVENOUS | Status: AC
  Filled 2022-09-12: qty 100

## 2022-09-12 MED ORDER — OXYCODONE HCL 5 MG PO TABS
ORAL_TABLET | ORAL | Status: AC
  Filled 2022-09-12: qty 1

## 2022-09-12 MED ORDER — FENTANYL CITRATE (PF) 100 MCG/2ML IJ SOLN
25.0000 ug | INTRAMUSCULAR | Status: DC | PRN
  Administered 2022-09-12 (×2): 25 ug via INTRAVENOUS

## 2022-09-12 MED ORDER — 0.9 % SODIUM CHLORIDE (POUR BTL) OPTIME
TOPICAL | Status: DC | PRN
  Administered 2022-09-12: 500 mL

## 2022-09-12 MED ORDER — FENTANYL CITRATE (PF) 100 MCG/2ML IJ SOLN
INTRAMUSCULAR | Status: DC | PRN
  Administered 2022-09-12 (×2): 50 ug via INTRAVENOUS

## 2022-09-12 MED ORDER — LACTATED RINGERS IV SOLN: INTRAVENOUS | Status: DC | PRN

## 2022-09-12 MED ORDER — ONDANSETRON HCL 4 MG/2ML IJ SOLN: 4.0000 mg | Freq: Once | INTRAMUSCULAR | Status: DC | PRN

## 2022-09-12 MED ORDER — BUPIVACAINE HCL (PF) 0.25 % IJ SOLN
INTRAMUSCULAR | Status: AC
  Filled 2022-09-12: qty 30

## 2022-09-12 MED ORDER — MIDAZOLAM HCL 2 MG/2ML IJ SOLN
INTRAMUSCULAR | Status: DC | PRN
  Administered 2022-09-12: 2 mg via INTRAVENOUS

## 2022-09-12 MED ORDER — PROPOFOL 10 MG/ML IV BOLUS
INTRAVENOUS | Status: AC
  Filled 2022-09-12: qty 20

## 2022-09-12 MED ORDER — BUPIVACAINE LIPOSOME 1.3 % IJ SUSP
INTRAMUSCULAR | Status: DC | PRN
  Administered 2022-09-12: 50 mL via INTRAMUSCULAR

## 2022-09-12 MED ORDER — OXYCODONE HCL 5 MG PO TABS
5.0000 mg | ORAL_TABLET | Freq: Once | ORAL | Status: AC | PRN
  Administered 2022-09-12: 5 mg via ORAL

## 2022-09-12 MED ORDER — ROCURONIUM BROMIDE 100 MG/10ML IV SOLN
INTRAVENOUS | Status: DC | PRN
  Administered 2022-09-12: 20 mg via INTRAVENOUS
  Administered 2022-09-12: 40 mg via INTRAVENOUS
  Administered 2022-09-12: 10 mg via INTRAVENOUS

## 2022-09-12 MED ORDER — SUCCINYLCHOLINE CHLORIDE 200 MG/10ML IV SOSY
PREFILLED_SYRINGE | INTRAVENOUS | Status: DC | PRN
  Administered 2022-09-12: 80 mg via INTRAVENOUS

## 2022-09-12 MED ORDER — HYDROCODONE-ACETAMINOPHEN 5-325 MG PO TABS: 1.0000 | ORAL_TABLET | ORAL | 0 refills | Status: DC | PRN

## 2022-09-12 MED ORDER — SUGAMMADEX SODIUM 200 MG/2ML IV SOLN
INTRAVENOUS | Status: DC | PRN
  Administered 2022-09-12: 200 mg via INTRAVENOUS

## 2022-09-12 MED ORDER — PHENYLEPHRINE HCL (PRESSORS) 10 MG/ML IV SOLN
INTRAVENOUS | Status: DC | PRN
  Administered 2022-09-12: 160 ug via INTRAVENOUS

## 2022-09-12 MED ORDER — ONDANSETRON HCL 4 MG/2ML IJ SOLN
INTRAMUSCULAR | Status: AC
  Filled 2022-09-12: qty 2

## 2022-09-12 MED ORDER — PROPOFOL 10 MG/ML IV BOLUS
INTRAVENOUS | Status: DC | PRN
  Administered 2022-09-12: 160 mg via INTRAVENOUS

## 2022-09-12 MED ORDER — MIDAZOLAM HCL 2 MG/2ML IJ SOLN
INTRAMUSCULAR | Status: AC
  Filled 2022-09-12: qty 2

## 2022-09-12 SURGICAL SUPPLY — 48 items
CANNULA REDUC XI 12-8 STAPL (CANNULA) ×2
CANNULA REDUCER 12-8 DVNC XI (CANNULA) ×2 IMPLANT
CATH REDDICK CHOLANGI 4FR 50CM (CATHETERS) IMPLANT
CLIP LIGATING HEMO O LOK GREEN (MISCELLANEOUS) ×2 IMPLANT
DERMABOND ADVANCED .7 DNX12 (GAUZE/BANDAGES/DRESSINGS) ×2 IMPLANT
DRAPE ARM DVNC X/XI (DISPOSABLE) ×8 IMPLANT
DRAPE COLUMN DVNC XI (DISPOSABLE) ×2 IMPLANT
DRAPE DA VINCI XI ARM (DISPOSABLE) ×8
DRAPE DA VINCI XI COLUMN (DISPOSABLE) ×2
ELECT CAUTERY BLADE 6.4 (BLADE) ×2 IMPLANT
ELECT REM PT RETURN 9FT ADLT (ELECTROSURGICAL) ×2
ELECTRODE REM PT RTRN 9FT ADLT (ELECTROSURGICAL) ×2 IMPLANT
GLOVE BIO SURGEON STRL SZ7 (GLOVE) ×4 IMPLANT
GOWN STRL REUS W/ TWL LRG LVL3 (GOWN DISPOSABLE) ×8 IMPLANT
GOWN STRL REUS W/TWL LRG LVL3 (GOWN DISPOSABLE) ×8
IRRIGATION STRYKERFLOW (MISCELLANEOUS) IMPLANT
IRRIGATOR STRYKERFLOW (MISCELLANEOUS) ×2
IV CATH ANGIO 12GX3 LT BLUE (NEEDLE) IMPLANT
KIT PINK PAD W/HEAD ARE REST (MISCELLANEOUS) ×2 IMPLANT
KIT PINK PAD W/HEAD ARM REST (MISCELLANEOUS) ×2 IMPLANT
LABEL OR SOLS (LABEL) ×2 IMPLANT
MANIFOLD NEPTUNE II (INSTRUMENTS) ×2 IMPLANT
NEEDLE HYPO 22GX1.5 SAFETY (NEEDLE) ×2 IMPLANT
NS IRRIG 500ML POUR BTL (IV SOLUTION) ×2 IMPLANT
OBTURATOR OPTICAL STANDARD 8MM (TROCAR) ×2
OBTURATOR OPTICAL STND 8 DVNC (TROCAR) ×2
OBTURATOR OPTICALSTD 8 DVNC (TROCAR) ×2 IMPLANT
PACK LAP CHOLECYSTECTOMY (MISCELLANEOUS) ×2 IMPLANT
PENCIL SMOKE EVACUATOR (MISCELLANEOUS) ×2 IMPLANT
SEAL CANN UNIV 5-8 DVNC XI (MISCELLANEOUS) ×6 IMPLANT
SEAL XI 5MM-8MM UNIVERSAL (MISCELLANEOUS) ×8
SET TUBE SMOKE EVAC HIGH FLOW (TUBING) ×2 IMPLANT
SOLUTION ELECTROLUBE (MISCELLANEOUS) ×2 IMPLANT
SPIKE FLUID TRANSFER (MISCELLANEOUS) ×2 IMPLANT
SPONGE T-LAP 18X18 ~~LOC~~+RFID (SPONGE) ×2 IMPLANT
SPONGE T-LAP 4X18 ~~LOC~~+RFID (SPONGE) IMPLANT
STAPLER CANNULA SEAL DVNC XI (STAPLE) ×2 IMPLANT
STAPLER CANNULA SEAL XI (STAPLE) ×2
STOPCOCK 3 WAY MALE LL (IV SETS)
STOPCOCK 3WAY MALE LL (IV SETS) IMPLANT
SUT MNCRL AB 4-0 PS2 18 (SUTURE) ×2 IMPLANT
SUT VICRYL 0 UR6 27IN ABS (SUTURE) ×4 IMPLANT
SYR 30ML LL (SYRINGE) ×2 IMPLANT
SYS BAG RETRIEVAL 10MM (BASKET) ×2
SYSTEM BAG RETRIEVAL 10MM (BASKET) ×2 IMPLANT
TRAP FLUID SMOKE EVACUATOR (MISCELLANEOUS) ×2 IMPLANT
WATER STERILE IRR 3000ML UROMA (IV SOLUTION) IMPLANT
WATER STERILE IRR 500ML POUR (IV SOLUTION) ×2 IMPLANT

## 2022-09-12 NOTE — Anesthesia Procedure Notes (Signed)
Procedure Name: Intubation Date/Time: 09/12/2022 9:26 AM  Performed by: Aline Brochure, CRNAPre-anesthesia Checklist: Patient identified, Patient being monitored, Timeout performed, Emergency Drugs available and Suction available Patient Re-evaluated:Patient Re-evaluated prior to induction Oxygen Delivery Method: Circle system utilized Preoxygenation: Pre-oxygenation with 100% oxygen Induction Type: IV induction Ventilation: Mask ventilation without difficulty Laryngoscope Size: 3 and McGraph Grade View: Grade I Tube type: Oral Tube size: 7.0 mm Number of attempts: 1 Airway Equipment and Method: Stylet and Video-laryngoscopy Placement Confirmation: ETT inserted through vocal cords under direct vision, positive ETCO2 and breath sounds checked- equal and bilateral Secured at: 21 cm Tube secured with: Tape Dental Injury: Teeth and Oropharynx as per pre-operative assessment

## 2022-09-12 NOTE — Op Note (Signed)
Robotic assisted laparoscopic Cholecystectomy  Pre-operative Diagnosis: acute cholecystitis  Post-operative Diagnosis: same  Procedure:  Robotic assisted laparoscopic Cholecystectomy  Surgeon: Chloe Miyoshi, MD FACS  Anesthesia: Gen. with endotracheal tube  Findings: Acute Cholecystitis   Estimated Blood Loss:5 cc       Specimens: Gallbladder           Complications: none   Procedure Details  The patient was seen again in the Holding Room. The benefits, complications, treatment options, and expected outcomes were discussed with the patient. The risks of bleeding, infection, recurrence of symptoms, failure to resolve symptoms, bile duct damage, bile duct leak, retained common bile duct stone, bowel injury, any of which could require further surgery and/or ERCP, stent, or papillotomy were reviewed with the patient. The likelihood of improving the patient's symptoms with return to their baseline status is good.  The patient and/or family concurred with the proposed plan, giving informed consent.  The patient was taken to Operating Room, identified  and the procedure verified as Laparoscopic Cholecystectomy.  A Time Out was held and the above information confirmed.  Prior to the induction of general anesthesia, antibiotic prophylaxis was administered. VTE prophylaxis was in place. General endotracheal anesthesia was then administered and tolerated well. After the induction, the abdomen was prepped with Chloraprep and draped in the sterile fashion. The patient was positioned in the supine position.  Cut down technique was used to enter the abdominal cavity and a Hasson trochar was placed after two vicryl stitches were anchored to the fascia. Pneumoperitoneum was then created with CO2 and tolerated well without any adverse changes in the patient's vital signs.  Three 8-mm ports were placed under direct vision. All skin incisions  were infiltrated with a local anesthetic agent before making the  incision and placing the trocars.   The patient was positioned  in reverse Trendelenburg, robot was brought to the surgical field and docked in the standard fashion.  We made sure all the instrumentation was kept indirect view at all times and that there were no collision between the arms. I scrubbed out and went to the console.  The gallbladder was identified, the fundus grasped and retracted cephalad. Adhesions were lysed bluntly. The infundibulum was grasped and retracted laterally, exposing the peritoneum overlying the triangle of Calot. This was then divided and exposed in a blunt fashion. An extended critical view of the cystic duct and cystic artery was obtained.  The cystic duct was clearly identified and bluntly dissected.   Artery and duct were double clipped and divided. Using ICG cholangiography we visualize the cystic duct and CBD w/o evidence of bile injuries. The gallbladder was taken from the gallbladder fossa in a retrograde fashion with the electrocautery.  Hemostasis was achieved with the electrocautery. nspection of the right upper quadrant was performed. No bleeding, bile duct injury or leak, or bowel injury was noted. Robotic instruments and robotic arms were undocked in the standard fashion.  I scrubbed back in.  The gallbladder was removed and placed in an Endocatch bag.   Pneumoperitoneum was released.  The periumbilical port site was closed with interrumpted 0 Vicryl sutures. 4-0 subcuticular Monocryl was used to close the skin. Dermabond was  applied.  The patient was then extubated and brought to the recovery room in stable condition. Sponge, lap, and needle counts were correct at closure and at the conclusion of the case.               Angela Soulliere, MD, FACS 

## 2022-09-12 NOTE — Discharge Instructions (Addendum)
Laparoscopic Cholecystectomy, Care After   These instructions give you information on caring for yourself after your procedure. Your doctor may also give you more specific instructions. Call your doctor if you have any problems or questions after your procedure.  HOME CARE  Change your bandages (dressings) as told by your doctor.  Keep the wound dry and clean. Wash the wound gently with soap and water. Pat the wound dry with a clean towel.  Do not take baths, swim, or use hot tubs for 2 weeks, or as told by your doctor.  Only take medicine as told by your doctor.  Eat a normal diet as told by your doctor.  Do not lift anything heavier than 10 pounds (4.5 kg) until your doctor says it is okay.  Do not play contact sports for 1 week, or as told by your doctor. GET HELP IF:  Your wound is red, puffy (swollen), or painful.  You have yellowish-white fluid (pus) coming from the wound.  You have fluid draining from the wound for more than 1 day.  You have a bad smell coming from the wound.  Your wound breaks open. GET HELP RIGHT AWAY IF:  You have trouble breathing.  You have chest pain.  You have a fever >101  You have pain in the shoulders (shoulder strap areas) that is getting worse.  You feel dizzy or pass out (faint).  You have severe belly (abdominal) pain.  You feel sick to your stomach (nauseous) or throw up (vomit) for more than 1 day.  AMBULATORY SURGERY  DISCHARGE INSTRUCTIONS   The drugs that you were given will stay in your system until tomorrow so for the next 24 hours you should not:  Drive an automobile Make any legal decisions Drink any alcoholic beverage   You may resume regular meals tomorrow.  Today it is better to start with liquids and gradually work up to solid foods.  You may eat anything you prefer, but it is better to start with liquids, then soup and crackers, and gradually work up to solid foods.   Please notify your doctor immediately if you have any  unusual bleeding, trouble breathing, redness and pain at the surgery site, drainage, fever, or pain not relieved by medication.    Additional Instructions:   Please contact your physician with any problems or Same Day Surgery at [phone], Monday through Friday 6 am to 4 pm, or [place] at [facility] number at [phone].   DO NOT REMOVE GREEN TEAL ARMBAND FOR 4 DAYS

## 2022-09-12 NOTE — ED Notes (Signed)
Pt reports continued nausea. Will administer PRN medication for relief.

## 2022-09-12 NOTE — Progress Notes (Signed)
Preoperative Review   Patient is met in the preoperative holding area. The history is reviewed in the chart and with the patient. I personally reviewed the options and rationale as well as the risks of this procedure that have been previously discussed with the patient. All questions asked by the patient and/or family were answered to their satisfaction.  Patient agrees to proceed with this procedure at this time.  Smith Mcnicholas M.D. FACS   

## 2022-09-12 NOTE — Anesthesia Preprocedure Evaluation (Addendum)
Anesthesia Evaluation  Patient identified by MRN, date of birth, ID band Patient awake    Reviewed: Allergy & Precautions, NPO status , Patient's Chart, lab work & pertinent test results  History of Anesthesia Complications Negative for: history of anesthetic complications  Airway Mallampati: II   Neck ROM: Full    Dental no notable dental hx.    Pulmonary neg pulmonary ROS   Pulmonary exam normal breath sounds clear to auscultation       Cardiovascular Normal cardiovascular exam+ Valvular Problems/Murmurs MVP  Rhythm:Regular Rate:Normal     Neuro/Psych  Headaches PSYCHIATRIC DISORDERS Anxiety        GI/Hepatic Crohn disease   Endo/Other  negative endocrine ROS    Renal/GU negative Renal ROS     Musculoskeletal   Abdominal   Peds  Hematology negative hematology ROS (+)   Anesthesia Other Findings   Reproductive/Obstetrics                             Anesthesia Physical Anesthesia Plan  ASA: 2  Anesthesia Plan: General   Post-op Pain Management:    Induction: Intravenous  PONV Risk Score and Plan: 3 and Ondansetron, Dexamethasone and Treatment may vary due to age or medical condition  Airway Management Planned: Oral ETT  Additional Equipment:   Intra-op Plan:   Post-operative Plan: Extubation in OR  Informed Consent: I have reviewed the patients History and Physical, chart, labs and discussed the procedure including the risks, benefits and alternatives for the proposed anesthesia with the patient or authorized representative who has indicated his/her understanding and acceptance.     Dental advisory given  Plan Discussed with: CRNA  Anesthesia Plan Comments: (Patient consented for risks of anesthesia including but not limited to:  - adverse reactions to medications - damage to eyes, teeth, lips or other oral mucosa - nerve damage due to positioning  - sore throat or  hoarseness - damage to heart, brain, nerves, lungs, other parts of body or loss of life  Informed patient about role of CRNA in peri- and intra-operative care.  Patient voiced understanding.)       Anesthesia Quick Evaluation

## 2022-09-12 NOTE — Anesthesia Postprocedure Evaluation (Signed)
Anesthesia Post Note  Patient: Marshell Garfinkel  Procedure(s) Performed: XI ROBOTIC ASSISTED LAPAROSCOPIC CHOLECYSTECTOMY (Abdomen) INDOCYANINE GREEN FLUORESCENCE IMAGING (ICG)  Patient location during evaluation: PACU Anesthesia Type: General Level of consciousness: awake and alert, oriented and patient cooperative Pain management: pain level controlled Vital Signs Assessment: post-procedure vital signs reviewed and stable Respiratory status: spontaneous breathing, nonlabored ventilation and respiratory function stable Cardiovascular status: blood pressure returned to baseline and stable Postop Assessment: adequate PO intake Anesthetic complications: no   No notable events documented.   Last Vitals:  Vitals:   09/12/22 1115 09/12/22 1130  BP: 110/88   Pulse: 85 94  Resp:    Temp:    SpO2: 96% 100%    Last Pain:  Vitals:   09/12/22 1130  TempSrc:   PainSc: [place]

## 2022-09-12 NOTE — Discharge Summary (Signed)
Patient ID: Angela Braun MRN: [id number] DOB/AGE: Sep 08, 1986 37 y.o.  Admit date: 09/11/2022 Discharge date: 09/12/2022   Discharge Diagnoses:  Principal Problem:   Acute cholecystitis   Procedures: Robotic cholecystectomy  Hospital Course:  37 yo femaleadmitted with findings consistent with acute cholecystitis and  was taken promptly to the operating room for an uneventful laparoscopic robotic cholecystectomy.  Patient was kept overnight.  At The time of discharge the patient was ambulating,  pain was controlled.  Her vital signs were stable and she was afebrile.   physical exam at discharge showed a pt  in no acute distress.  Awake and alert.  Abdomen: Soft incisions healing well without infection or peritonitis.  Extremities well-perfused and no edema.  Condition of the patient the time of discharge was stable   Disposition: Discharge disposition: 01-Home or Self Care       Discharge Instructions     Call MD for:  difficulty breathing, headache or visual disturbances   Complete by: As directed    Call MD for:  extreme fatigue   Complete by: As directed    Call MD for:  hives   Complete by: As directed    Call MD for:  persistant dizziness or light-headedness   Complete by: As directed    Call MD for:  persistant nausea and vomiting   Complete by: As directed    Call MD for:  redness, tenderness, or signs of infection (pain, swelling, redness, odor or green/yellow discharge around incision site)   Complete by: As directed    Call MD for:  severe uncontrolled pain   Complete by: As directed    Call MD for:  temperature >100.4   Complete by: As directed    Diet - low sodium heart healthy   Complete by: As directed    Discharge instructions   Complete by: As directed    Shower 48 hrs   Increase activity slowly   Complete by: As directed    Lifting restrictions   Complete by: As directed    20 lbs x 6 wks   No wound care   Complete by: As directed        Allergies as of 09/12/2022       Reactions   Penicillins Hives   Latex Rash        Medication List     STOP taking these medications    cephALEXin 500 MG capsule Commonly known as: KEFLEX   traZODone 50 MG tablet Commonly known as: DESYREL       TAKE these medications    FLUoxetine 20 MG tablet Commonly known as: PROZAC TAKE 2 TABLETS (40 MG TOTAL) BY MOUTH DAILY.   HYDROcodone-acetaminophen 5-325 MG tablet Commonly known as: NORCO/VICODIN Take 1-2 tablets by mouth every 4 (four) hours as needed for moderate pain.   Liletta (52 MG) 20.1 MCG/DAY Iud IUD Generic drug: levonorgestrel 1 each by Intrauterine route once.   topiramate 50 MG tablet Commonly known as: TOPAMAX Take 150 mg by mouth daily.   Vitamin D (Ergocalciferol) 1.25 MG (50000 UNIT) Caps capsule Commonly known as: DRISDOL Take 1 capsule (50,000 Units total) by mouth every 7 (seven) days. (taking one tablet per week) lab  in office schedule 1-2 weeks after completing prescription.        Follow-up Information     Tylene Fantasia, PA-C Follow up on 09/25/2022.   Specialty: Physician Assistant Why: @ 1:45 pm Contact information: [address] [place]  [place], MD FACS

## 2022-09-12 NOTE — Transfer of Care (Signed)
Immediate Anesthesia Transfer of Care Note  Patient: Angela Braun  Procedure(s) Performed: XI ROBOTIC ASSISTED LAPAROSCOPIC CHOLECYSTECTOMY (Abdomen) INDOCYANINE GREEN FLUORESCENCE IMAGING (ICG)  Patient Location: PACU  Anesthesia Type:General  Level of Consciousness: awake  Airway & Oxygen Therapy: Patient Spontanous Breathing and Patient connected to face mask oxygen  Post-op Assessment: Report given to RN and Post -op Vital signs reviewed and stable  Post vital signs: Reviewed and stable  Last Vitals:  Vitals Value Taken Time  BP 106/66 09/12/22 1047  Temp    Pulse 88 09/12/22 1052  Resp 23 09/12/22 1052  SpO2 100 % 09/12/22 1052  Vitals shown include unvalidated device data.  Last Pain:  Vitals:   09/12/22 0840  TempSrc: Oral  PainSc: 2          Complications: No notable events documented.

## 2022-09-15 LAB — SURGICAL PATHOLOGY

## 2022-09-25 ENCOUNTER — Encounter: Payer: Self-pay | Admitting: Physician Assistant

## 2022-09-25 ENCOUNTER — Ambulatory Visit (INDEPENDENT_AMBULATORY_CARE_PROVIDER_SITE_OTHER): Payer: Managed Care, Other (non HMO) | Admitting: Physician Assistant

## 2022-09-25 VITALS — BP 110/74 | HR 74 | Temp 98.1°F | Ht 67.0 in | Wt 172.8 lb

## 2022-09-25 DIAGNOSIS — K810 Acute cholecystitis: Secondary | ICD-10-CM

## 2022-09-25 DIAGNOSIS — Z09 Encounter for follow-up examination after completed treatment for conditions other than malignant neoplasm: Secondary | ICD-10-CM

## 2022-09-25 NOTE — Progress Notes (Signed)
[facility] POST-OP OFFICE VISIT  09/25/2022  HPI: Angela Braun is a 37 y.o. female 13 days s/p robotic assisted laparoscopic cholecystectomy for acute cholecystitis with Dr Dahlia Byes.   She is doing well No abdominal pain, fever, chills, nausea, emesis Tolerating PO; no diarrhea Baseline constipation improved with colace No issues with incisions themselves  No other complaints   Vital signs: BP 110/74   Pulse 74   Temp 98.1 F (36.7 C) (Oral)   Ht 5\' 7"  (1.702 m)   Wt 172 lb 12.8 oz (78.4 kg)   BMI 27.06 kg/m    Physical Exam: Constitutional: Well appearing female, NAD Abdomen: Soft, non-tender, non-distended, no rebound/guarding Skin: Laparoscopic incisions are healing well, no erythema or drainage   Assessment/Plan: This is a 37 y.o. female 13 days s/p robotic assisted laparoscopic cholecystectomy for acute cholecystitis with Dr Dahlia Byes.    - Pain control prn  - Reviewed wound care recommendation  - Reviewed lifting restrictions; 4 weeks total  - Reviewed surgical pathology; [place]  - She can follow up on as needed basis; She understands to call with questions/concerns  -- Edison Simon, PA-C [facility] 09/25/2022, 2:00 PM M-F: 7am - 4pm

## 2022-09-25 NOTE — Patient Instructions (Signed)

## 2023-03-12 ENCOUNTER — Emergency Department: Payer: Managed Care, Other (non HMO)

## 2023-03-12 ENCOUNTER — Other Ambulatory Visit: Payer: Self-pay

## 2023-03-12 ENCOUNTER — Emergency Department
Admission: EM | Admit: 2023-03-12 | Discharge: 2023-03-12 | Disposition: A | Discharge status: Home / Self Care | Payer: Managed Care, Other (non HMO) | Attending: Emergency Medicine | Admitting: Emergency Medicine

## 2023-03-12 ENCOUNTER — Ambulatory Visit
Admission: EM | Admit: 2023-03-12 | Discharge: 2023-03-12 | Disposition: A | Discharge status: Home / Self Care | Payer: Managed Care, Other (non HMO)

## 2023-03-12 DIAGNOSIS — R0602 Shortness of breath: Secondary | ICD-10-CM | POA: Diagnosis not present

## 2023-03-12 DIAGNOSIS — R531 Weakness: Secondary | ICD-10-CM

## 2023-03-12 DIAGNOSIS — R0789 Other chest pain: Secondary | ICD-10-CM | POA: Insufficient documentation

## 2023-03-12 DIAGNOSIS — R002 Palpitations: Secondary | ICD-10-CM | POA: Diagnosis not present

## 2023-03-12 DIAGNOSIS — R079 Chest pain, unspecified: Secondary | ICD-10-CM

## 2023-03-12 HISTORY — DX: Unspecified pre-eclampsia, unspecified trimester: O14.90

## 2023-03-12 LAB — BASIC METABOLIC PANEL WITH GFR
Anion gap: 9 (ref 5–15)
BUN: 13 mg/dL (ref 6–20)
CO2: 19 mmol/L — ABNORMAL LOW (ref 22–32)
Calcium: 9 mg/dL (ref 8.9–10.3)
Chloride: 109 mmol/L (ref 98–111)
Creatinine, Ser: 0.93 mg/dL (ref 0.44–1.00)
GFR, Estimated: >60 mL/min
Glucose, Bld: 98 mg/dL (ref 70–99)
Potassium: 3.7 mmol/L (ref 3.5–5.1)
Sodium: 137 mmol/L (ref 135–145)

## 2023-03-12 LAB — TROPONIN I (HIGH SENSITIVITY)
Troponin I (High Sensitivity): 3 ng/L
Troponin I (High Sensitivity): 3 ng/L (ref <18)

## 2023-03-12 LAB — T4, FREE: Free T4: 0.83 ng/dL (ref 0.61–1.12)

## 2023-03-12 LAB — CBC
HCT: 41 % (ref 36.0–46.0)
Hemoglobin: 14.1 g/dL (ref 12.0–15.0)
MCH: 32.8 pg (ref 26.0–34.0)
MCHC: 34.4 g/dL (ref 30.0–36.0)
MCV: 95.3 fL (ref 80.0–100.0)
Platelets: 238 10*3/uL (ref 150–400)
RBC: 4.3 MIL/uL (ref 3.87–5.11)
RDW: 12.4 % (ref 11.5–15.5)
WBC: 7.4 10*3/uL (ref 4.0–10.5)
nRBC: 0 % (ref 0.0–0.2)

## 2023-03-12 LAB — MAGNESIUM: Magnesium: 2 mg/dL (ref 1.7–2.4)

## 2023-03-12 LAB — POC URINE PREG, ED: Preg Test, Ur: NEGATIVE

## 2023-03-12 LAB — TSH: TSH: 0.862 u[IU]/mL (ref 0.350–4.500)

## 2023-03-12 NOTE — Discharge Instructions (Signed)
Go to the emergency department for evaluation of your heart palpitations, chest pressure, shortness of breath, and generalized weakness.

## 2023-03-12 NOTE — Discharge Instructions (Signed)
Your testing in the emergency department shows it is highly unlikely you have any emergency conditions to account for your symptoms like heart attack, blood clot, bacterial pneumonia, punctured lung.  It is important that you follow-up with your primary doctor within the next week for an appointment.  I made a referral to the heart doctor who will call you to establish care as a new patient to further assess your palpitations and recheck on your mitral valve problems that you have had in the past.  If you experience any new, worsening, or unexpected symptoms call your doctor right away or come back to the emergency department for reevaluation.

## 2023-03-12 NOTE — ED Provider Notes (Signed)
Renaldo Fiddler    CSN: [id number] Arrival date & time: 03/12/23  1344      History   Chief Complaint Chief Complaint  Patient presents with   Headache    HPI Angela Braun is a 37 y.o. female.  Patient presents with an episode this morning of heart palpitations, chest tightness like she "was suffocating", nausea, generalized weakness. She sat down and the episode resolved after a few minutes.  She took her blood pressure during the episode and it was 147/105.  No fever, numbness, focal weakness, dizziness, vision change, or other symptoms.  She had a similar episode 3 weeks ago.  She also reports 1 week history of fatigue, headache, congestion.  Treating with Mucinex.  Her medical history includes Crohn's disease, partial small bowel obstruction, migraine headache, mitral valve prolapse.    The history is provided by the patient and medical records.    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   Partial small bowel obstruction (HCC) 05/23/2019   Generalized anxiety disorder 10/01/2018    Past Surgical History:  Procedure Laterality Date   CESAREAN SECTION N/A 06/16/2018   Procedure: CESAREAN SECTION;  Surgeon: Conard Novak, MD;  Location: [facility] ORS;  Service: Obstetrics;  Laterality: N/A;   CHOLECYSTECTOMY     COLONOSCOPY     COLONOSCOPY WITH PROPOFOL N/A 12/01/2019   Procedure: COLONOSCOPY WITH PROPOFOL;  Surgeon: Wyline Mood, MD;  Location: [facility] ENDOSCOPY;  Service: Gastroenterology;  Laterality: N/A;    OB History     Gravida  2   Para  2   Term   1   Preterm  1   AB      Living  3      SAB      IAB      Ectopic      Multiple  1   Live Births  3            Home Medications    Prior to Admission medications   Medication Sig Start Date End Date Taking? Authorizing Provider  buPROPion (WELLBUTRIN XL) 150 MG 24 hr tablet Take 150 mg by mouth daily. 03/04/23  Yes [provider]  naltrexone (DEPADE) 50 MG tablet SMARTSIG:0.25-0.5 Tablet(s) By Mouth Daily 03/04/23  Yes [provider]  topiramate (TOPAMAX) 25 MG tablet Take 50 mg by mouth 2 (two) times daily. 02/10/23  Yes [provider]  FLUoxetine (PROZAC) 20 MG tablet TAKE 2 TABLETS (40 MG TOTAL) BY MOUTH DAILY. 07/22/21   Conard Novak, MD  levonorgestrel (LILETTA, 52 MG,) 19.5 MCG/DAY IUD IUD 1 each by Intrauterine route once.    [provider]  topiramate (TOPAMAX) 50 MG tablet Take 150 mg by mouth daily. 08/25/22   [provider]    Family History Family History  Problem Relation Age of Onset   Kidney disease Mother    Cancer Father    Ovarian cancer Maternal Aunt     Social History Social History   Tobacco Use   Smoking status: Never  Smokeless tobacco: Never  Vaping Use   Vaping Use: Never used  Substance Use Topics   Alcohol use: Yes    Comment: OCASSIONALLY   Drug use: No     Allergies   Penicillins and Latex   Review of Systems Review of Systems  Constitutional:  Positive for fatigue. Negative for chills and fever.  Respiratory:  Positive for shortness of breath. Negative for cough.   Cardiovascular:  Positive for chest pain and palpitations. Negative for leg swelling.  Gastrointestinal:  Positive for nausea. Negative for abdominal pain, diarrhea and vomiting.  Skin:  Negative for color change and rash.  Neurological:  Positive for weakness and headaches. Negative for dizziness, syncope, facial asymmetry and numbness.     Physical Exam Triage Vital Signs ED Triage Vitals  Enc  Vitals Group     BP      Pulse      Resp      Temp      Temp src      SpO2      Weight      Height      Head Circumference      Peak Flow      Pain Score      Pain Loc      Pain Edu?      Excl. in GC?    No data found.  Updated Vital Signs BP 120/86   Pulse 72   Temp 98.6 F (37 C)   Resp 18   SpO2 98%   Visual Acuity Right Eye Distance:   Left Eye Distance:   Bilateral Distance:    Right Eye Near:   Left Eye Near:    Bilateral Near:     Physical Exam Vitals and nursing note reviewed.  Constitutional:      General: She is not in acute distress.    Appearance: She is well-developed. She is ill-appearing.  HENT:     Mouth/Throat:     Mouth: Mucous membranes are moist.  Cardiovascular:     Rate and Rhythm: Normal rate and regular rhythm.     Heart sounds: Normal heart sounds.  Pulmonary:     Effort: Pulmonary effort is normal. No respiratory distress.     Breath sounds: Normal breath sounds.  Musculoskeletal:     Cervical back: Neck supple.     Right lower leg: No edema.     Left lower leg: No edema.  Skin:    General: Skin is warm and dry.  Neurological:     General: No focal deficit present.     Mental Status: She is alert and oriented to person, place, and time.     Sensory: No sensory deficit.     Motor: No weakness.     Gait: Gait normal.  Psychiatric:        Mood and Affect: Mood normal.        Behavior: Behavior normal.      UC Treatments / Results  Labs (all labs ordered are listed, but only abnormal results are displayed) Labs Reviewed - No data to display  EKG   Radiology No results found.  Procedures Procedures (including critical care time)  Medications Ordered in UC Medications - No data to display  Initial Impression / Assessment and Plan / UC Course  I have reviewed the triage vital signs and the nursing notes.  Pertinent labs & imaging results that were available during my care of the patient were reviewed by me and  considered in my medical decision making (see chart for details).   Heart palpitations, chest pressure, shortness of breath, generalized weakness. EKG shows sinus rhythm, rate 88, no ST elevation, no previous to compare.  Given her symptoms, sending her to the ED for evaluation.  She is agreeable to this and feels stable to drive herself.  Vital signs are stable.  She will drive herself to [facility] ED.  Final Clinical Impressions(s) / UC Diagnoses   Final diagnoses:  Heart palpitations  Chest pressure  Shortness of breath  Generalized weakness     Discharge Instructions      Go to the emergency department for evaluation of your heart palpitations, chest pressure, shortness of breath, and generalized weakness.       ED Prescriptions   None    PDMP not reviewed this encounter.   Mickie Bail, NP 03/12/23 1419

## 2023-03-12 NOTE — ED Triage Notes (Signed)
Pt sent from urgent care due to chest palpitations that have been on and off all day. Pt denies current chest pain. Pt denies shortness of breath. Pt denies anything that makes it worse. Pt reports some nausea as well. NAD noted

## 2023-03-12 NOTE — ED Triage Notes (Addendum)
Patient to Urgent Care with complaints of headaches/ nasal congestion, reports overall just not feeling well. Denies any known fevers. Symptoms x1 week.  Reports episode of hypertension this morning with a BP of 147/105/ palpitations/ nausea/ dizziness.   Has been taking mucinex.

## 2023-03-12 NOTE — ED Provider Notes (Signed)
[facility] Provider Note    Event Date/Time   First MD Initiated Contact with Patient 03/12/23 1652     (approximate)   History   Chest Pain   HPI  Angela Braun is a 37 y.o. female   Past medical history of mitral valve prolapse, migraine, Crohn disease, generalized anxiety disorder who presents to the emergency department with an episode of palpitations along with chest pressure and shortness of breath while cooking breakfast this morning.  She did not syncopized.  She just got over a cold last week.  She denies fever or cough.  She feels better now.  Denies any drug or alcohol use.  Denies GI or GU complaints.  External Medical Documents Reviewed: Urgent care note from earlier today with heart palpitations, chest tightness referred to the emergency department for further evaluation.      Physical Exam   Triage Vital Signs: ED Triage Vitals  Enc Vitals Group     BP 03/12/23 1442 (!) 123/91     Pulse Rate 03/12/23 1442 97     Resp 03/12/23 1442 18     Temp 03/12/23 1442 98.3 F (36.8 C)     Temp Source 03/12/23 1442 Oral     SpO2 03/12/23 1442 99 %     Weight 03/12/23 1443 170 lb (77.1 kg)     Height 03/12/23 1443 5\' 6"  (1.676 m)     Head Circumference --      Peak Flow --      Pain Score 03/12/23 1445 0     Pain Loc --      Pain Edu? --      Excl. in GC? --     Most recent vital signs: Vitals:   03/12/23 1442 03/12/23 1700  BP: (!) 123/91 110/78  Pulse: 97 84  Resp: 18 17  Temp: 98.3 F (36.8 C)   SpO2: 99% 100%    General: Awake, no distress.  CV:  Good peripheral perfusion.  Resp:  Normal effort.  Abd:  No distention.  Other:  Wake alert comfortable appearing with normal vital signs, normal rate.  No hypoxemia no respiratory distress.  Clear lungs soft nontender abdomen appears euvolemic no unilateral leg swelling or pain.   ED Results / Procedures / Treatments   Labs (all labs ordered are listed, but only  abnormal results are displayed) Labs Reviewed  BASIC METABOLIC PANEL - Abnormal; Notable for the following components:      Result Value   CO2 19 (*)    All other components within normal limits  CBC  MAGNESIUM  TSH  T4, FREE  POC URINE PREG, ED  TROPONIN I (HIGH SENSITIVITY)  TROPONIN I (HIGH SENSITIVITY)     I ordered and reviewed the above labs they are notable for troponin initial is negative.  EKG  ED ECG REPORT I, Pilar Jarvis, the attending physician, personally viewed and interpreted this ECG.   Date: 03/12/2023  EKG Time: 1441  Rate: 103  Rhythm: sinus  Axis: nl  Intervals:none  ST&T Change: no stemi    RADIOLOGY I independently reviewed and interpreted chest x-ray and I see no obvious focality or pneumothorax   PROCEDURES:  Critical Care performed: No  Procedures   MEDICATIONS ORDERED IN ED: Medications - No data to display  IMPRESSION / MDM / ASSESSMENT AND PLAN / ED COURSE  I reviewed the triage vital signs and the nursing notes.  Patient's presentation is most consistent with acute presentation with potential threat to life or bodily function.  Differential diagnosis includes, but is not limited to, dysrhythmia, ACS, PE, dissection, bacterial pneumonia respiratory infection, electrolyte derangement, thyroid dysfunction   The patient is on the cardiac monitor to evaluate for evidence of arrhythmia and/or significant heart rate changes.  MDM: Well-appearing patient with episode of chest pressure palpitation shortness of breath now resolved.  EKG without evidence of STEMI ischemia or malignant dysrhythmias.  Patient appears well.  Considered bacterial pneumonia given this episode after a viral URI last week, checked EKG and clear with normal white blood cell count afebrile and asymptomatic now will defer antibiotics given no evidence.  Considered cardiac ischemia, EKG without ischemic changes, initial troponin  negative, will check second troponin flat then ruled out.  Low risk factors.  Considered PE but Wells low risk, PERC negative.  Will check thyroid, electrolytes, repeat troponin if all looks well she continues to be stable plan will be for discharge.  I will make a referral to cardiology given her mitral valve prolapse history and palpitations for Holter monitoring further assessment.       FINAL CLINICAL IMPRESSION(S) / ED DIAGNOSES   Final diagnoses:  Nonspecific chest pain  Palpitations  Shortness of breath     Rx / DC Orders   ED Discharge Orders          Ordered    Ambulatory referral to Cardiology       Comments: If you have not heard from the Cardiology office within the next 72 hours please call [phone].   03/12/23 1722             Note:  This document was prepared using Dragon voice recognition software and may include unintentional dictation errors.    Pilar Jarvis, MD 03/12/23 [phone]

## 2023-03-20 ENCOUNTER — Telehealth: Payer: Self-pay | Admitting: *Deleted

## 2023-03-20 ENCOUNTER — Encounter: Payer: Self-pay | Admitting: *Deleted

## 2023-03-20 NOTE — Telephone Encounter (Signed)
Lmovm to verify pt's card hx/pcp.

## 2023-03-23 ENCOUNTER — Ambulatory Visit: Payer: Managed Care, Other (non HMO) | Attending: Cardiology | Admitting: Cardiology

## 2023-03-23 ENCOUNTER — Ambulatory Visit: Payer: Managed Care, Other (non HMO)

## 2023-03-23 ENCOUNTER — Encounter: Payer: Self-pay | Admitting: Cardiology

## 2023-03-23 VITALS — BP 110/88 | HR 99 | Ht 66.0 in | Wt 173.2 lb

## 2023-03-23 DIAGNOSIS — Z8679 Personal history of other diseases of the circulatory system: Secondary | ICD-10-CM | POA: Diagnosis not present

## 2023-03-23 DIAGNOSIS — R002 Palpitations: Secondary | ICD-10-CM

## 2023-03-23 DIAGNOSIS — R072 Precordial pain: Secondary | ICD-10-CM

## 2023-03-23 NOTE — Patient Instructions (Signed)
Medication Instructions:   Your physician recommends that you continue on your current medications as directed. Please refer to the Current Medication list given to you today.  *If you need a refill on your cardiac medications before your next appointment, please call your pharmacy*   Lab Work:  None Ordered  If you have labs (blood work) drawn today and your tests are completely normal, you will receive your results only by: MyChart Message (if you have MyChart) OR A paper copy in the mail If you have any lab test that is abnormal or we need to change your treatment, we will call you to review the results.   Testing/Procedures:  Your physician has requested that you have an echocardiogram. Echocardiography is a painless test that uses sound waves to create images of your heart. It provides your doctor with information about the size and shape of your heart and how well your heart's chambers and valves are working. This procedure takes approximately one hour. There are no restrictions for this procedure. Please do NOT wear cologne, perfume, aftershave, or lotions (deodorant is allowed). Please arrive 15 minutes prior to your appointment time.  Your physician has recommended that you wear a Zio monitor.   This monitor is a medical device that records the heart's electrical activity. Doctors most often use these monitors to diagnose arrhythmias. Arrhythmias are problems with the speed or rhythm of the heartbeat. The monitor is a small device applied to your chest. You can wear one while you do your normal daily activities. While wearing this monitor if you have any symptoms to push the button and record what you felt. Once you have worn this monitor for the period of time provider prescribed (Usually 14 days), you will return the monitor device in the postage paid box. Once it is returned they will download the data collected and provide us with a report which the provider will then review and  we will call you with those results. Important tips:  Avoid showering during the first 24 hours of wearing the monitor. Avoid excessive sweating to help maximize wear time. Do not submerge the device, no hot tubs, and no swimming pools. Keep any lotions or oils away from the patch. After 24 hours you may shower with the patch on. Take brief showers with your back facing the shower head.  Do not remove patch once it has been placed because that will interrupt data and decrease adhesive wear time. Push the button when you have any symptoms and write down what you were feeling. Once you have completed wearing your monitor, remove and place into box which has postage paid and place in your outgoing mailbox.  If for some reason you have misplaced your box then call our office and we can provide another box and/or mail it off for you.      Follow-Up: At [facility], you and your health needs are our priority.  As part of our continuing mission to provide you with exceptional heart care, we have created designated Provider Care Teams.  These Care Teams include your primary Cardiologist (physician) and Advanced Practice Providers (APPs -  Physician Assistants and Nurse Practitioners) who all work together to provide you with the care you need, when you need it.  We recommend signing up for the patient portal called "MyChart".  Sign up information is provided on this After Visit Summary.  MyChart is used to connect with patients for Virtual Visits (Telemedicine).  Patients are able to   view lab/test results, encounter notes, upcoming appointments, etc.  Non-urgent messages can be sent to your provider as well.   To learn more about what you can do with MyChart, go to [email or web address].    Your next appointment:    After testing  Provider:   You may see Brian Agbor-Etang, MD or one of the following Advanced Practice Providers on your designated Care Team:   Christopher Berge,  NP Ryan Dunn, PA-C Cadence Furth, PA-C Sheri Hammock, NP  

## 2023-03-23 NOTE — Progress Notes (Signed)
Cardiology Office Note:    Date:  03/23/2023   ID:  Angela Braun, DOB 1986/01/18, MRN [id number]  PCP:  Ronnette Juniper, MD   [facility] Providers Cardiologist:  Debbe Odea, MD    Referring MD: Pilar Jarvis, MD   Chief Complaint  Patient presents with   New Patient (Initial Visit)    Referred for Tachycardia evaluation.  Patient seen in ED on 03/12/23 for palpitations.  Was previously seen by cardiology in about 19 years ago for palpitations and mitral valve prolapse.     Angela Braun is a 37 y.o. female who is being seen today for the evaluation of chest pain at the request of Pilar Jarvis, MD.   History of Present Illness:    Angela Braun is a 38 y.o. female with a hx of migraines who presents with palpitations and chest pain.  States having palpitations beginning 5 months ago.  Symptoms typically occur 1-2 times weekly, lasting a minute or so.  She usually gets out of breath, has occasional chest pain.  On the weekend of July for well with her family, occurred associated with nausea.  Patient was advised by her parents to go to the ED .  In the ED, workup with troponins and EKG was unrevealing.  Patient was discharged with recommendations to follow-up with cardiology.  Still has occasional palpitations, no syncope.  Denies any history of heart disease.  Has a history of mitral valve prolapse, diagnosed around age 9.  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   Preeclampsia     Past Surgical History:  Procedure Laterality Date   BREAST REDUCTION SURGERY  2002   CESAREAN SECTION N/A 06/16/2018   Procedure: CESAREAN SECTION;  Surgeon: Conard Novak, MD;  Location: [facility] ORS;  Service: Obstetrics;  Laterality: N/A;   CHOLECYSTECTOMY     COLONOSCOPY     COLONOSCOPY WITH PROPOFOL N/A 12/01/2019   Procedure:  COLONOSCOPY WITH PROPOFOL;  Surgeon: Wyline Mood, MD;  Location: [facility] ENDOSCOPY;  Service: Gastroenterology;  Laterality: N/A;   tummy tuck  2002    Current Medications: Current Meds  Medication Sig   buPROPion (WELLBUTRIN XL) 150 MG 24 hr tablet Take 150 mg by mouth daily.   FLUoxetine (PROZAC) 40 MG capsule Take 40 mg by mouth daily.   levonorgestrel (LILETTA, 52 MG,) 19.5 MCG/DAY IUD IUD 1 each by Intrauterine route once.   naltrexone (DEPADE) 50 MG tablet SMARTSIG:0.25-0.5 Tablet(s) By Mouth Daily   topiramate (TOPAMAX) 100 MG tablet Take 100 mg by mouth daily.     Allergies:   Penicillins and Latex   Social History   Socioeconomic History   Marital status: Married    Spouse name: Not on file   Number of children: Not on file   Years of education: Not on file   Highest education level: Not on file  Occupational History   Not on file  Tobacco Use   Smoking status: Never   Smokeless tobacco: Never  Vaping Use   Vaping status: Every Day  Substance and Sexual Activity   Alcohol use: Yes    Comment: OCASSIONALLY   Drug use: No   Sexual activity: Yes    Birth control/protection: Implant  Other Topics Concern   Not on file  Social History Narrative  Not on file   Social Determinants of Health   Financial Resource Strain: Low Risk  (06/16/2018)   Overall Financial Resource Strain (CARDIA)    Difficulty of Paying Living Expenses: Not hard at all  Food Insecurity: No Food Insecurity (09/12/2022)   Hunger Vital Sign    Worried About Running Out of Food in the Last Year: Never true    Ran Out of Food in the Last Year: Never true  Transportation Needs: No Transportation Needs (09/12/2022)   PRAPARE - Administrator, Civil Service (Medical): No    Lack of Transportation (Non-Medical): No  Physical Activity: Unknown (06/16/2018)   Exercise Vital Sign    Days of Exercise per Week: 0 days    Minutes of Exercise per Session: Not on file  Stress: No Stress Concern  Present (06/16/2018)   [employer] of Occupational Health - Occupational Stress Questionnaire    Feeling of Stress : Not at all  Social Connections: Moderately Integrated (06/16/2018)   Social Connection and Isolation Panel [NHANES]    Frequency of Communication with Friends and Family: More than three times a week    Frequency of Social Gatherings with Friends and Family: Twice a week    Attends Religious Services: 1 to 4 times per year    Active Member of [employer] or Organizations: No    Attends Engineer, structural: Never    Marital Status: Married     Family History: The patient's family history includes Cancer in her father; Kidney disease in her mother; Ovarian cancer in her maternal aunt.  ROS:   Please see the history of present illness.     All other systems reviewed and are negative.  EKGs/Labs/Other Studies Reviewed:    The following studies were reviewed today:  EKG Interpretation Date/Time:  Monday March 23 2023 11:53:55 EDT Ventricular Rate:  99 PR Interval:  118 QRS Duration:  84 QT Interval:  358 QTC Calculation: 459 R Axis:   37  Text Interpretation: Normal sinus rhythm Normal ECG Confirmed by Debbe Odea ([id number]) on 03/23/2023 11:56:29 AM    Recent Labs: 09/11/2022: ALT 22 03/12/2023: BUN 13; Creatinine, Ser 0.93; Hemoglobin 14.1; Magnesium 2.0; Platelets 238; Potassium 3.7; Sodium 137; TSH 0.862  Recent Lipid Panel    Component Value Date/Time   CHOL 151 09/25/2021 0930   TRIG 102.0 09/25/2021 0930   HDL 54.50 09/25/2021 0930   CHOLHDL 3 09/25/2021 0930   VLDL 20.4 09/25/2021 0930   LDLCALC 76 09/25/2021 0930     Risk Assessment/Calculations:             Physical Exam:    VS:  BP 110/88 (BP Location: Left Arm, Patient Position: Sitting, Cuff Size: Normal)   Pulse 99   Ht 5\' 6"  (1.676 m)   Wt 173 lb 3.2 oz (78.6 kg)   SpO2 99%   BMI 27.96 kg/m     Wt Readings from Last 3 Encounters:  03/23/23 173 lb 3.2 oz (78.6 kg)   03/12/23 170 lb (77.1 kg)  09/25/22 172 lb 12.8 oz (78.4 kg)     GEN:  Well nourished, well developed in no acute distress HEENT: Normal NECK: No JVD; No carotid bruits CARDIAC: RRR, no murmurs, rubs, gallops RESPIRATORY:  Clear to auscultation without rales, wheezing or rhonchi  ABDOMEN: Soft, non-tender, non-distended MUSCULOSKELETAL:  No edema; No deformity  SKIN: Warm and dry NEUROLOGIC:  Alert and oriented x 3 PSYCHIATRIC:  Normal affect   ASSESSMENT:  1. Palpitations   2. H/O mitral valve prolapse   3. Precordial pain    PLAN:    In order of problems listed above:  Palpitations, place cardiac monitor to evaluate any significant arrhythmias. History of mitral valve prolapse, obtain echocardiogram. Chest pain, not consistent with angina, echo as above to rule out any structural abnormalities.  Patient is low risk from a cardiac perspective.  Follow-up after echo and cardiac monitor.     Medication Adjustments/Labs and Tests Ordered: Current medicines are reviewed at length with the patient today.  Concerns regarding medicines are outlined above.  Orders Placed This Encounter  Procedures   LONG TERM MONITOR (3-14 DAYS)   EKG 12-Lead   ECHOCARDIOGRAM COMPLETE   No orders of the defined types were placed in this encounter.   Patient Instructions  Medication Instructions:   Your physician recommends that you continue on your current medications as directed. Please refer to the Current Medication list given to you today.  *If you need a refill on your cardiac medications before your next appointment, please call your pharmacy*   Lab Work:  None Ordered  If you have labs (blood work) drawn today and your tests are completely normal, you will receive your results only by: MyChart Message (if you have MyChart) OR A paper copy in the mail If you have any lab test that is abnormal or we need to change your treatment, we will call you to review the  results.   Testing/Procedures:  Your physician has requested that you have an echocardiogram. Echocardiography is a painless test that uses sound waves to create images of your heart. It provides your doctor with information about the size and shape of your heart and how well your heart's chambers and valves are working. This procedure takes approximately one hour. There are no restrictions for this procedure. Please do NOT wear cologne, perfume, aftershave, or lotions (deodorant is allowed). Please arrive 15 minutes prior to your appointment time.   Your physician has recommended that you wear a Zio monitor.   This monitor is a medical device that records the heart's electrical activity. Doctors most often use these monitors to diagnose arrhythmias. Arrhythmias are problems with the speed or rhythm of the heartbeat. The monitor is a small device applied to your chest. You can wear one while you do your normal daily activities. While wearing this monitor if you have any symptoms to push the button and record what you felt. Once you have worn this monitor for the period of time provider prescribed (Usually 14 days), you will return the monitor device in the postage paid box. Once it is returned they will download the data collected and provide [place] with a report which the provider will then review and we will call you with those results. Important tips:  Avoid showering during the first 24 hours of wearing the monitor. Avoid excessive sweating to help maximize wear time. Do not submerge the device, no hot tubs, and no swimming pools. Keep any lotions or oils away from the patch. After 24 hours you may shower with the patch on. Take brief showers with your back facing the shower head.  Do not remove patch once it has been placed because that will interrupt data and decrease adhesive wear time. Push the button when you have any symptoms and write down what you were feeling. Once you have completed  wearing your monitor, remove and place into box which has postage paid and place in your  outgoing mailbox.  If for some reason you have misplaced your box then call our office and we can provide another box and/or mail it off for you.  Follow-Up: At [facility], you and your health needs are our priority.  As part of our continuing mission to provide you with exceptional heart care, we have created designated Provider Care Teams.  These Care Teams include your primary Cardiologist (physician) and Advanced Practice Providers (APPs -  Physician Assistants and Nurse Practitioners) who all work together to provide you with the care you need, when you need it.  We recommend signing up for the patient portal called "MyChart".  Sign up information is provided on this After Visit Summary.  MyChart is used to connect with patients for Virtual Visits (Telemedicine).  Patients are able to view lab/test results, encounter notes, upcoming appointments, etc.  Non-urgent messages can be sent to your provider as well.   To learn more about what you can do with MyChart, go to [email or web address].    Your next appointment:    After testing  Provider:   You may see Debbe Odea, MD or one of the following Advanced Practice Providers on your designated Care Team:   Nicolasa Ducking, NP Eula Listen, PA-C Cadence Fransico Michael, PA-C Charlsie Quest, NP    Signed, Debbe Odea, MD  03/23/2023 12:25 PM    [facility]

## 2023-03-25 DIAGNOSIS — R002 Palpitations: Secondary | ICD-10-CM | POA: Diagnosis not present

## 2023-03-30 ENCOUNTER — Encounter: Payer: Self-pay | Admitting: Cardiology

## 2023-04-08 ENCOUNTER — Other Ambulatory Visit: Payer: Self-pay | Admitting: Cardiology

## 2023-04-08 DIAGNOSIS — Z8679 Personal history of other diseases of the circulatory system: Secondary | ICD-10-CM

## 2023-04-08 DIAGNOSIS — R002 Palpitations: Secondary | ICD-10-CM

## 2023-04-08 DIAGNOSIS — R072 Precordial pain: Secondary | ICD-10-CM

## 2023-04-15 ENCOUNTER — Ambulatory Visit: Payer: Managed Care, Other (non HMO) | Attending: Cardiology

## 2023-04-15 DIAGNOSIS — Z8679 Personal history of other diseases of the circulatory system: Secondary | ICD-10-CM

## 2023-04-15 DIAGNOSIS — R002 Palpitations: Secondary | ICD-10-CM

## 2023-04-15 DIAGNOSIS — R072 Precordial pain: Secondary | ICD-10-CM | POA: Diagnosis not present

## 2023-04-15 DIAGNOSIS — I341 Nonrheumatic mitral (valve) prolapse: Secondary | ICD-10-CM

## 2023-04-20 ENCOUNTER — Encounter: Payer: Self-pay | Admitting: Cardiology

## 2023-04-28 NOTE — Progress Notes (Unsigned)
Cardiology Office Note:  .   Date:  04/30/2023  ID:  Angela Braun, DOB 1986-06-07, MRN [id number] PCP: Ronnette Juniper, MD  [facility] Providers Cardiologist:  Debbe Odea, MD    History of Present Illness: Angela Braun is a 37 y.o. female with past medical history of migraines, Crohns disease, reported history of mitral valve prolapse, palpitations and atypical chest pain. She is followed by Dr. Azucena Cecil, she presents today for follow up regarding palpitations.   On 03/12/23 she presented to [facility] following an urgent care visit reporting an episode of heart palpitations, chest tightness, nausea and generalized weakness, resolved after a few minutes of rest.   On 03/23/23 she was seen by Dr. Azucena Cecil, she reported having palpitations starting five months prior. Reported symptoms typically occurred 1-2 times a week lasting a minute. It was recommended she wear a cardiac monitor and have echocardiogram. In 03/2023 she wore a cardiac monitor for nine days that indicated minimum heart rate of 52 bpm, max HR of 158 bpm and average HR of 80 bpm. Predominant underling rhythm was sinus rhythm, she had one run of SVT lasing 10 beats with max rate of 158 bpm. Her triggered events were associated with sinus rhythm or sinus tachycardia. Her cardiac workup was unremarkable, with negative troponin x2. It was recommended she follow up with cardiology. Echocardiogram on 04/15/2023 indicated LVEF of 55 to 60%, LV with normal function and no RWMA, indeterminate diastolic filling, RV normal function, mitral vavle was normal in structure, trivial mitral valve regurgitation.   Today she presents for follow up. She reports she has been doing well. Notes ongoing intermittent palpitations, she had a short burst of palpitations on Sunday that were not bothersome. However on Wednesday she had an episode in which she was driving her children home from school when she started having pains in her right  arm that traveled up into her shoulder and neck then across her chest. Reported this as well as increased heart rate around 100 bpm for about three hours. She went home to rest, noted increased fatigue and overall feeling unwell. Further she endorses chest tightness and dyspnea on exertion, resolves with stopping of exertion.   Lipid profile on 12/16/2022 indicated total cholesterol of 142 and LDL 71.   ROS: Today she denies lower extremity edema, melena, hematuria, hemoptysis, diaphoresis, weakness, presyncope, syncope, orthopnea, and PND.   Studies Reviewed: .       Cardiac Studies & Procedures       ECHOCARDIOGRAM  ECHOCARDIOGRAM COMPLETE 04/15/2023  Narrative ECHOCARDIOGRAM REPORT    Patient Name:   Angela Braun Date of Exam: 04/15/2023 Medical Rec #:  [id number]       Height:       66.0 in Accession #:    [id number]      Weight:       173.2 lb Date of Birth:  03/11/1986       BSA:          1.881 m Patient Age:    37 years        BP:           110/88 mmHg Patient Gender: F               HR:           91  bpm. Exam Location:  [place]  Procedure: 2D Echo, 3D Echo, Cardiac Doppler, Color Doppler and Strain Analysis  Indications:    I34.1  Nonrheumatic mitral (valve) prolapse  History:        Patient has no prior history of Echocardiogram examinations. Mitral Valve Prolapse, Arrythmias:Tachycardia, Signs/Symptoms:Chest Pain; Risk Factors:Non-Smoker.  Sonographer:    Ilda Mori RDCS, MHA Referring Phys: [id number] Debbe Odea   Sonographer Comments: Global longitudinal strain was attempted. IMPRESSIONS   1. Left ventricular ejection fraction, by estimation, is 55 to 60%. The left ventricle has normal function. The left ventricle has no regional wall motion abnormalities. Indeterminate diastolic filling due to E-A fusion. The average left ventricular global longitudinal strain is -18.9 %. The global longitudinal strain is normal. 2. Right ventricular systolic function  is normal. The right ventricular size is normal. 3. The mitral valve is normal in structure. Trivial mitral valve regurgitation. No evidence of mitral stenosis. 4. The aortic valve has an indeterminant number of cusps. Aortic valve regurgitation is not visualized. No aortic stenosis is present. 5. The inferior vena cava is normal in size with greater than 50% respiratory variability, suggesting right atrial pressure of 3 mmHg.  FINDINGS Left Ventricle: Left ventricular ejection fraction, by estimation, is 55 to 60%. The left ventricle has normal function. The left ventricle has no regional wall motion abnormalities. The average left ventricular global longitudinal strain is -18.9 %. The global longitudinal strain is normal. The left ventricular internal cavity size was normal in size. There is no left ventricular hypertrophy. Indeterminate diastolic filling due to E-A fusion.  Right Ventricle: The right ventricular size is normal. No increase in right ventricular wall thickness. Right ventricular systolic function is normal.  Left Atrium: Left atrial size was normal in size.  Right Atrium: Right atrial size was normal in size.  Pericardium: There is no evidence of pericardial effusion.  Mitral Valve: The mitral valve is normal in structure. Trivial mitral valve regurgitation. No evidence of mitral valve stenosis.  Tricuspid Valve: The tricuspid valve is normal in structure. Tricuspid valve regurgitation is trivial.  Aortic Valve: The aortic valve has an indeterminant number of cusps. Aortic valve regurgitation is not visualized. No aortic stenosis is present. Aortic valve mean gradient measures 5.6 mmHg. Aortic valve peak gradient measures 9.4 mmHg.  Pulmonic Valve: The pulmonic valve was normal in structure. Pulmonic valve regurgitation is not visualized. No evidence of pulmonic stenosis.  Aorta: The aortic root and ascending aorta are structurally normal, with no evidence of  dilitation.  Pulmonary Artery: The pulmonary artery is of normal size.  Venous: The inferior vena cava is normal in size with greater than 50% respiratory variability, suggesting right atrial pressure of 3 mmHg.  IAS/Shunts: The interatrial septum was not well visualized.   LEFT VENTRICLE PLAX 2D LVIDd:         4.90 cm LVIDs:         3.50 cm 2D Longitudinal Strain LV PW:         0.70 cm 2D Strain GLS Avg:     -18.9 % LV IVS:        0.60 cm  3D Volume EF: 3D EF:        53 % LV EDV:       146 ml LV ESV:       68 ml LV SV:        78 ml  RIGHT VENTRICLE RV Basal diam:  2.80 cm RV Mid diam:    1.80 cm RV S prime:     15.80 cm/s TAPSE (M-mode): 2.8 cm  LEFT ATRIUM  Index        RIGHT ATRIUM           Index LA diam:        3.20 cm 1.70 cm/m   RA Area:     11.24 cm LA Vol (A2C):   45.4 ml 24.13 ml/m  RA Volume:   26.60 ml  14.14 ml/m LA Vol (A4C):   43.2 ml 22.96 ml/m LA Biplane Vol: 48.5 ml 25.78 ml/m AORTIC VALVE AV Vmax:           153.30 cm/s AV Vmean:          113.123 cm/s AV VTI:            0.299 m AV Peak Grad:      9.4 mmHg AV Mean Grad:      5.6 mmHg LVOT Vmax:         142.54 cm/s LVOT Vmean:        97.953 cm/s LVOT VTI:          0.245 m LVOT/AV VTI ratio: 0.82  AORTA Ao Root diam: 3.10 cm Ao Asc diam:  3.00 cm   SHUNTS Systemic VTI: 0.24 m  Yvonne Kendall MD Electronically signed by Yvonne Kendall MD Signature Date/Time: 04/16/2023/11:26:43 AM    Final    MONITORS  LONG TERM MONITOR (3-14 DAYS) 04/07/2023  Narrative Patch Wear Time:  9 days and 10 hours (2024-07-17T21:59:57-0400 to 2024-07-27T08:59:53-0400)  Patient had a min HR of 52 bpm, max HR of 158 bpm, and avg HR of 80 bpm. Predominant underlying rhythm was Sinus Rhythm. 1 run of Supraventricular Tachycardia occurred lasting 10 beats with a max rate of 158 bpm (avg 151 bpm). Isolated SVEs were rare (<1.0%), and no SVE Couplets or SVE Triplets were present. Isolated VEs were  rare (<1.0%), and no VE Couplets or VE Triplets were present.  Conclusion 1 episode of paroxysmal SVT. No significant sustained arrhythmias noted. Overall benign cardiac monitor. Patient triggered events associated with sinus rhythm or sinus tachycardia.                    Physical Exam:   VS:  BP 110/78 (BP Location: Left Arm, Patient Position: Sitting, Cuff Size: Normal)   Pulse 89   Ht 5\' 7"  (1.702 m)   Wt 176 lb (79.8 kg)   SpO2 99%   BMI 27.57 kg/m    Wt Readings from Last 3 Encounters:  04/30/23 176 lb (79.8 kg)  03/23/23 173 lb 3.2 oz (78.6 kg)  03/12/23 170 lb (77.1 kg)    GEN: Well nourished, well developed in no acute distress NECK: No JVD; No carotid bruits CARDIAC: RRR, no murmurs, rubs, gallops RESPIRATORY:  Clear to auscultation without rales, wheezing or rhonchi  ABDOMEN: Soft, non-tender, non-distended EXTREMITIES:  No edema; No deformity   ASSESSMENT AND PLAN: .    Precordial pain/DOE: Reported intermittent chest pain with palpitations when at the ED in 03/2023. Cardiac workup unremarkable at that time. Echocardiogram indicated LVEF of 55 to 60%, LV with normal function and no RWMA. Today she reports ongoing chest tightness with exertion associated with dyspnea on exertion. She also endorses a three hour episode of chest pain with right arm pain, resulting in increased fatigue requiring her to be in bed the rest of the day. Will obtain coronary CTA for ongoing precordial pain, she will take metoprolol tartrate prior to study. Check BMET and hCG today.   Palpitations: ED visit on 03/12/23 for palpitations and  chest pain, cardiac workup unremarkable. Reported five months of episodes of palpitations lasting a minute, 1-2 times a week. She had normal lab workup at that time including TSH. In 03/2023 she wore a cardiac monitor for nine days that indicated minimum heart rate of 52 bpm, max HR of 158 bpm and average HR of 80 bpm. Predominant underling rhythm was sinus  rhythm, she had one run of SVT lasing 10 beats with max rate of 158 bpm. Her triggered events were associated with sinus rhythm or sinus tachycardia. Today reports occasional bursts of palpitations that are not bothersome, no intervention needed at this time. Reviewed potential triggers such as alcohol, dehydration, electrolyte abnormalities, stress and caffeine intake.   Hx of mitral valve prolapse: Reported history of MV prolapse around the age of 61. Echocardiogram showed mitral valve was normal in structure, trivial mitral valve regurgitation.     Dispo: Follow up with Charlsie Quest, NP following coronary CTA.   Signed, Rip Harbour, NP

## 2023-04-30 ENCOUNTER — Encounter: Payer: Self-pay | Admitting: Cardiology

## 2023-04-30 ENCOUNTER — Ambulatory Visit: Payer: Managed Care, Other (non HMO) | Attending: Cardiology | Admitting: Cardiology

## 2023-04-30 VITALS — BP 110/78 | HR 89 | Ht 67.0 in | Wt 176.0 lb

## 2023-04-30 DIAGNOSIS — R002 Palpitations: Secondary | ICD-10-CM

## 2023-04-30 DIAGNOSIS — R0602 Shortness of breath: Secondary | ICD-10-CM

## 2023-04-30 DIAGNOSIS — Z79899 Other long term (current) drug therapy: Secondary | ICD-10-CM

## 2023-04-30 DIAGNOSIS — R072 Precordial pain: Secondary | ICD-10-CM | POA: Diagnosis not present

## 2023-04-30 MED ORDER — METOPROLOL TARTRATE 100 MG PO TABS: 100.0000 mg | ORAL_TABLET | Freq: Once | ORAL | 0 refills | Status: DC

## 2023-04-30 NOTE — Patient Instructions (Signed)
Medication Instructions:  Metoprolol Tartrate 100 mg 2 hours prior to Cardiac CT. *If you need a refill on your cardiac medications before your next appointment, please call your pharmacy*   Lab Work: Your provider would like for you to have following labs drawn today BMP.   If you have labs (blood work) drawn today and your tests are completely normal, you will receive your results only by: MyChart Message (if you have MyChart) OR A paper copy in the mail If you have any lab test that is abnormal or we need to change your treatment, we will call you to review the results.   Testing/Procedures:   Your cardiac CT will be scheduled at one of the below locations:   [facility] [address] [place], [place] [address] [phone]  OR   [facility] [address] [place], [place] [address] [phone]   If scheduled at [facility] or [facility], please arrive 15 mins early for check-in and test prep.  There is spacious parking and easy access to the radiology department from the [facility] entrance. Please enter here and check-in with the desk attendant.   Please follow these instructions carefully (unless otherwise directed):  An IV will be required for this test and Nitroglycerin will be given.   On the Night Before the Test: Be sure to Drink plenty of water. Do not consume any caffeinated/decaffeinated beverages or chocolate 12 hours prior to your test. Do not take any antihistamines 12 hours prior to your test.  On the Day of the Test: Drink plenty of water until 1 hour prior to the test. Do not eat any food 1 hour prior to test. You may take your regular medications prior to the test.  Take metoprolol tartrate 100 mg (Lopressor) two hours prior to test. FEMALES- please wear underwire-free bra if available, avoid dresses & tight  clothing  After the Test: Drink plenty of water. After receiving IV contrast, you may experience a mild flushed feeling. This is normal. On occasion, you may experience a mild rash up to 24 hours after the test. This is not dangerous. If this occurs, you can take Benadryl 25 mg and increase your fluid intake. If you experience trouble breathing, this can be serious. If it is severe call 911 IMMEDIATELY. If it is mild, please call our office. If you take any of these medications: Glipizide/Metformin, Avandament, Glucavance, please do not take 48 hours after completing test unless otherwise instructed.  We will call to schedule your test 2-4 weeks out understanding that some insurance companies will need an authorization prior to the service being performed.   For more information and frequently asked questions, please visit our website : [email or web address]  For non-scheduling related questions, please contact the cardiac imaging nurse navigator should you have any questions/concerns: Cardiac Imaging Nurse Navigators Direct Office Dial: [phone]   For scheduling needs, including cancellations and rescheduling, please call [place], [phone].    Follow-Up: At [facility], you and your health needs are our priority.  As part of our continuing mission to provide you with exceptional heart care, we have created designated Provider Care Teams.  These Care Teams include your primary Cardiologist (physician) and Advanced Practice Providers (APPs -  Physician Assistants and Nurse Practitioners) who all work together to provide you with the care you need, when you need it.  We recommend signing up for the patient portal  called "MyChart".  Sign up information is provided on this After Visit Summary.  MyChart is used to connect with patients for Virtual Visits (Telemedicine).  Patients are able to view lab/test results, encounter notes, upcoming appointments, etc.   Non-urgent messages can be sent to your provider as well.   To learn more about what you can do with MyChart, go to [email or web address].    Your next appointment:   Follow up after Cardiac CT.  1-2 months  Provider:   You may see Debbe Odea, MD or one of the following Advanced Practice Providers on your designated Care Team:   Nicolasa Ducking, NP Eula Listen, PA-C Cadence Fransico Michael, PA-C Charlsie Quest, NP

## 2023-05-01 LAB — BASIC METABOLIC PANEL
BUN/Creatinine Ratio: 11 (ref 9–23)
BUN: 11 mg/dL (ref 6–20)
CO2: 19 mmol/L — ABNORMAL LOW (ref 20–29)
Calcium: 8.8 mg/dL (ref 8.7–10.2)
Chloride: 108 mmol/L — ABNORMAL HIGH (ref 96–106)
Creatinine, Ser: 0.99 mg/dL (ref 0.57–1.00)
Glucose: 86 mg/dL (ref 70–99)
Potassium: 4.2 mmol/L (ref 3.5–5.2)
Sodium: 140 mmol/L (ref 134–144)
eGFR: 75 mL/min/{1.73_m2} (ref >59)

## 2023-05-01 LAB — HCG, SERUM, QUALITATIVE: hCG,Beta Subunit,Qual,Serum: NEGATIVE m[IU]/mL (ref <6)

## 2023-05-05 ENCOUNTER — Encounter (HOSPITAL_COMMUNITY): Payer: Self-pay

## 2023-05-07 ENCOUNTER — Ambulatory Visit
Admission: RE | Admit: 2023-05-07 | Discharge: 2023-05-07 | Disposition: A | Discharge status: Home / Self Care | Payer: Managed Care, Other (non HMO) | Source: Ambulatory Visit | Attending: Cardiology | Admitting: Cardiology

## 2023-05-07 DIAGNOSIS — R072 Precordial pain: Secondary | ICD-10-CM

## 2023-05-07 MED ORDER — METOPROLOL TARTRATE 5 MG/5ML IV SOLN
5.0000 mg | Freq: Once | INTRAVENOUS | Status: AC | PRN
  Administered 2023-05-07: 5 mg via INTRAVENOUS

## 2023-05-07 MED ORDER — IOHEXOL 350 MG/ML SOLN
75.0000 mL | Freq: Once | INTRAVENOUS | Status: AC | PRN
  Administered 2023-05-07: 75 mL via INTRAVENOUS

## 2023-05-07 MED ORDER — DILTIAZEM HCL 25 MG/5ML IV SOLN: 10.0000 mg | INTRAVENOUS | Status: DC | PRN

## 2023-05-07 MED ORDER — SODIUM CHLORIDE 0.9 % IV SOLN: INTRAVENOUS | Status: DC

## 2023-05-07 MED ORDER — NITROGLYCERIN 0.4 MG SL SUBL
0.4000 mg | SUBLINGUAL_TABLET | Freq: Once | SUBLINGUAL | Status: AC
  Administered 2023-05-07: 0.4 mg via SUBLINGUAL

## 2023-05-07 NOTE — Progress Notes (Signed)
Patient tolerated CT well. Drank water after. Vital signs stable encourage to drink water throughout day.Reasons explained and verbalized understanding. Ambulated steady gait.  

## 2023-05-12 ENCOUNTER — Telehealth: Payer: Self-pay | Admitting: *Deleted

## 2023-05-12 NOTE — Telephone Encounter (Signed)
Spoke with patient and reviewed results with her in detail. Answered all of her questions and advised to call back if she should have any further questions. She verbalized understanding with no further questions at this time.

## 2023-12-30 ENCOUNTER — Emergency Department
Admission: EM | Admit: 2023-12-30 | Discharge: 2023-12-30 | Disposition: A | Discharge status: Home / Self Care | Attending: Emergency Medicine | Admitting: Emergency Medicine

## 2023-12-30 ENCOUNTER — Emergency Department

## 2023-12-30 DIAGNOSIS — K529 Noninfective gastroenteritis and colitis, unspecified: Secondary | ICD-10-CM | POA: Insufficient documentation

## 2023-12-30 DIAGNOSIS — R1030 Lower abdominal pain, unspecified: Secondary | ICD-10-CM

## 2023-12-30 LAB — CBC
HCT: 39.2 % (ref 36.0–46.0)
Hemoglobin: 13.4 g/dL (ref 12.0–15.0)
MCH: 32.9 pg (ref 26.0–34.0)
MCHC: 34.2 g/dL (ref 30.0–36.0)
MCV: 96.3 fL (ref 80.0–100.0)
Platelets: 186 10*3/uL (ref 150–400)
RBC: 4.07 MIL/uL (ref 3.87–5.11)
RDW: 12.3 % (ref 11.5–15.5)
WBC: 3.9 10*3/uL — ABNORMAL LOW (ref 4.0–10.5)
nRBC: 0 % (ref 0.0–0.2)

## 2023-12-30 LAB — LIPASE, BLOOD: Lipase: 27 U/L (ref 11–51)

## 2023-12-30 LAB — URINALYSIS, ROUTINE W REFLEX MICROSCOPIC
Bilirubin Urine: NEGATIVE
Glucose, UA: NEGATIVE mg/dL
Ketones, ur: NEGATIVE mg/dL
Nitrite: NEGATIVE
Protein, ur: 30 mg/dL — AB
Specific Gravity, Urine: 1.025 (ref 1.005–1.030)
pH: 5 (ref 5.0–8.0)

## 2023-12-30 LAB — COMPREHENSIVE METABOLIC PANEL WITH GFR
ALT: 16 U/L (ref 0–44)
AST: 16 U/L (ref 15–41)
Albumin: 3.8 g/dL (ref 3.5–5.0)
Alkaline Phosphatase: 37 U/L — ABNORMAL LOW (ref 38–126)
Anion gap: 6 (ref 5–15)
BUN: 11 mg/dL (ref 6–20)
CO2: 20 mmol/L — ABNORMAL LOW (ref 22–32)
Calcium: 8.7 mg/dL — ABNORMAL LOW (ref 8.9–10.3)
Chloride: 110 mmol/L (ref 98–111)
Creatinine, Ser: 0.99 mg/dL (ref 0.44–1.00)
GFR, Estimated: >60 mL/min (ref >60)
Glucose, Bld: 104 mg/dL — ABNORMAL HIGH (ref 70–99)
Potassium: 3.5 mmol/L (ref 3.5–5.1)
Sodium: 136 mmol/L (ref 135–145)
Total Bilirubin: 0.7 mg/dL (ref 0.0–1.2)
Total Protein: 7 g/dL (ref 6.5–8.1)

## 2023-12-30 LAB — PREGNANCY, URINE: Preg Test, Ur: NEGATIVE

## 2023-12-30 MED ORDER — DICYCLOMINE HCL 10 MG PO CAPS: 10.0000 mg | ORAL_CAPSULE | Freq: Three times a day (TID) | ORAL | 0 refills | Status: DC

## 2023-12-30 MED ORDER — ONDANSETRON 4 MG PO TBDP: 4.0000 mg | ORAL_TABLET | Freq: Three times a day (TID) | ORAL | 0 refills | Status: AC | PRN

## 2023-12-30 MED ORDER — IOHEXOL 350 MG/ML SOLN
75.0000 mL | Freq: Once | INTRAVENOUS | Status: AC | PRN
  Administered 2023-12-30: 75 mL via INTRAVENOUS

## 2023-12-30 MED ORDER — SODIUM CHLORIDE 0.9 % IV BOLUS
1000.0000 mL | Freq: Once | INTRAVENOUS | Status: AC
  Administered 2023-12-30: 1000 mL via INTRAVENOUS

## 2023-12-30 MED ORDER — ONDANSETRON HCL 4 MG/2ML IJ SOLN
4.0000 mg | Freq: Once | INTRAMUSCULAR | Status: AC
  Administered 2023-12-30: 4 mg via INTRAVENOUS
  Filled 2023-12-30: qty 2

## 2023-12-30 MED ORDER — KETOROLAC TROMETHAMINE 15 MG/ML IJ SOLN
15.0000 mg | Freq: Once | INTRAMUSCULAR | Status: AC
  Administered 2023-12-30: 15 mg via INTRAVENOUS
  Filled 2023-12-30: qty 1

## 2023-12-30 NOTE — ED Provider Notes (Signed)
 4:31 PM Assumed care for off going team.   Blood pressure 109/70, pulse 98, temperature 98.5 F (36.9 C), temperature source Oral, resp. rate 18, height 5\' 8"  (1.727 m), weight 80.7 kg, SpO2 98%.  See their HPI for full report but in brief pending CT imaging   IMPRESSION: No acute abnormality seen in the abdomen or pelvis.    4:43 PM Reevaluated patient she reports pain in her bellybutton area.  No rebound, no guarding on examination.  Discussed CT imaging.  She does report 20 episodes of diarrhea and associated nausea.  I wonder if this could be a GI bug.  We discussed stool testing but given otherwise healthy no risk factors including recent antibiotics, travel [place] , drinking [place] water, not immunosuppressed have opted to hold off on stool testing.  We discussed symptomatic treatment at home and following up with her GI doctor if continues to have symptoms for evaluation.  We discussed that the CT scan is not a colonoscopy and if she needs a deeper evaluation for her Crohn's disease she would need to follow-up for colonoscopy.  Patient expressed understanding and felt comfortable with this plan and would like to be discharged home.  I recommended her do a p.o. challenge but patient declined stating that she just wanted to be discharged home.        Lubertha Rush, MD 12/30/23 [phone]

## 2023-12-30 NOTE — ED Provider Notes (Signed)
 [facility] Provider Note    Event Date/Time   First MD Initiated Contact with Patient 12/30/23 1402     (approximate)   History   Chief Complaint: Abdominal Pain   HPI  Angela Braun is a 38 y.o. female with a past history of Crohn's disease, migraines, cholecystectomy who comes ED complaining of suprapubic abdominal pain radiating to the right low back since yesterday, associated nausea and diarrhea.  Also has chills.  No fever.  No sick contacts.  Denies vomiting.  No chest pain or shortness of breath.  Reports that she has not been on any DMARD medication for her Crohn's in quite a long time and has not had any flareups during this time.  Denies a history of ovarian cyst or kidney stone.  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   Preeclampsia     Current Outpatient Rx   Order #: [id number] Class: Historical Med   Order #: [id number] Class: Historical Med   Order #: [id number] Class: Historical Med   Order #: [id number] Class: Normal   Order #: [id number] Class: Historical Med   Order #: [id number] Class: Historical Med   Order #: [id number] Class: Historical Med   Order #: [id number] Class: Historical Med    Past Surgical History:  Procedure Laterality Date   BREAST REDUCTION SURGERY  2002   CESAREAN SECTION N/A 06/16/2018   Procedure: CESAREAN SECTION;  Surgeon: Kris Pester, MD;  Location: [facility] ORS;  Service: Obstetrics;  Laterality: N/A;   CHOLECYSTECTOMY     COLONOSCOPY     COLONOSCOPY WITH PROPOFOL  N/A 12/01/2019   Procedure: COLONOSCOPY WITH PROPOFOL ;  Surgeon: Luke Salaam, MD;  Location: [facility] ENDOSCOPY;  Service: Gastroenterology;  Laterality: N/A;   tummy tuck  2002    Physical Exam   Triage Vital Signs: ED Triage Vitals  Encounter Vitals Group     BP 12/30/23 1251 109/70      Systolic BP Percentile --      Diastolic BP Percentile --      Pulse Rate 12/30/23 1251 98     Resp 12/30/23 1251 18     Temp 12/30/23 1251 98.5 F (36.9 C)     Temp Source 12/30/23 1251 Oral     SpO2 12/30/23 1251 98 %     Weight 12/30/23 1250 178 lb (80.7 kg)     Height 12/30/23 1250 5\' 8"  (1.727 m)     Head Circumference --      Peak Flow --      Pain Score 12/30/23 1249 7     Pain Loc --      Pain Education --      Exclude from Growth Chart --     Most recent vital signs: Vitals:   12/30/23 1251  BP: 109/70  Pulse: 98  Resp: 18  Temp: 98.5 F (36.9 C)  SpO2: 98%    General: Awake, no distress.  CV:  Good peripheral perfusion.  Regular rate and rhythm Resp:  Normal effort.  Clear to auscultation bilaterally Abd:  No distention.  Soft with suprapubic tenderness.  No CVA tenderness Other:  Dry oral mucosa   ED Results / Procedures / Treatments   Labs (all labs ordered are listed, but only abnormal results are displayed) Labs Reviewed  COMPREHENSIVE METABOLIC PANEL WITH GFR - Abnormal; Notable for the following components:      Result Value   CO2 20 (*)    Glucose, Bld 104 (*)    Calcium 8.7 (*)    Alkaline Phosphatase 37 (*)    All other components within normal limits  CBC - Abnormal; Notable for the following components:   WBC 3.9 (*)    All other components within normal limits  URINALYSIS, ROUTINE W REFLEX MICROSCOPIC - Abnormal; Notable for the following components:   Color, Urine YELLOW (*)    APPearance CLOUDY (*)    Hgb urine dipstick LARGE (*)    Protein, ur 30 (*)    Leukocytes,Ua TRACE (*)    Bacteria, UA RARE (*)    All other components within normal limits  LIPASE, BLOOD  PREGNANCY, URINE     EKG    RADIOLOGY CT abdomen pelvis pending   PROCEDURES:  Procedures   MEDICATIONS ORDERED IN ED: Medications  sodium chloride  0.9 % bolus 1,000 mL (has no administration in time range)  ketorolac  (TORADOL ) 15 MG/ML injection 15 mg  (has no administration in time range)  ondansetron  (ZOFRAN ) injection 4 mg (has no administration in time range)     IMPRESSION / MDM / ASSESSMENT AND PLAN / ED COURSE  I reviewed the triage vital signs and the nursing notes.  DDx: Cystitis, ovarian cyst, ureterolithiasis, Crohn's flareup, viral enteritis, appendicitis  Patient's presentation is most consistent with acute presentation with potential threat to life or bodily function.  Patient presents with low abdominal pain radiating to the right flank with tenderness.  Not having specific symptoms of cystitis, urinalysis not convincing of UTI.  Symptomatology suggest a viral illness, but with her Crohn's history will obtain CT while giving IV fluids, Toradol  and Zofran  for hydration and symptom relief.       FINAL CLINICAL IMPRESSION(S) / ED DIAGNOSES   Final diagnoses:  Lower abdominal pain     Rx / DC Orders   ED Discharge Orders     None        Note:  This document was prepared using Dragon voice recognition software and may include unintentional dictation errors.   Jacquie Maudlin, MD 12/30/23 [phone]

## 2023-12-30 NOTE — ED Triage Notes (Signed)
 Pt presents to the ED POV from home for suprapubic abdominal pain, lower back pain, nausea, and diarrhea since yesterday.

## 2023-12-30 NOTE — Discharge Instructions (Addendum)
 Take Tylenol  1 g every 8 hours with ibuprofen  600 every 8 hours with food to help with pain.  You can get Imodium over-the-counter for diarrhea  Return to the ER for fevers, worsening pain or any other concerns otherwise please call your GI doctor and make a follow-up appointment to discuss need for colonoscopy

## 2023-12-30 NOTE — ED Triage Notes (Signed)
 First Nurse Note: Patient to ED from [facility] for suprapubic abd pain x2 days w/ diarrhea and nausea. Pain radiates to right lower back.

## 2024-05-25 ENCOUNTER — Other Ambulatory Visit: Payer: Self-pay

## 2024-05-25 DIAGNOSIS — R319 Hematuria, unspecified: Secondary | ICD-10-CM

## 2024-05-26 ENCOUNTER — Ambulatory Visit
Admission: RE | Admit: 2024-05-26 | Discharge: 2024-05-26 | Disposition: A | Discharge status: Home / Self Care | Source: Ambulatory Visit

## 2024-05-26 DIAGNOSIS — R319 Hematuria, unspecified: Secondary | ICD-10-CM | POA: Diagnosis present

## 2024-06-30 ENCOUNTER — Encounter: Payer: Self-pay | Admitting: Certified Nurse Midwife

## 2024-06-30 NOTE — Progress Notes (Unsigned)
 GYNECOLOGY ANNUAL PHYSICAL EXAM PROGRESS NOTE  Subjective:    Angela Braun is a 38 y.o. G16P1103 female who presents for an annual exam.  The patient {is/is not/has never been:13135} sexually active. The patient participates in regular exercise: {yes/no/not asked:9010}. Has the patient ever been transfused or tattooed?: {yes/no/not asked:9010}. The patient reports that there {is/is not:9024} domestic violence in her life.   The patient has the following complaints today:   Menstrual History: Menarche age: *** No LMP recorded. (Menstrual status: IUD).     Gynecologic History:  Contraception: IUD History of STI's:  Last Pap: 12/05/2019. Results were: normal. Notes h/o abnormal pap smears. Last mammogram: Not age appropriate     OB History  Gravida Para Term Preterm AB Living  2 2 1 1  0 3  SAB IAB Ectopic Multiple Live Births  0 0 0 1 3    # Outcome Date GA Lbr Len/2nd Weight Sex Type Anes PTL Lv  2A Preterm 06/16/18 [redacted]w[redacted]d  5 lb 8.5 oz (2.51 kg) M CS-LTranv Spinal  LIV     Name: Rojek,BOYA Andreea     Apgar1: 8  Apgar5: 9  2B Preterm 06/16/18 [redacted]w[redacted]d  6 lb 1.4 oz (2.76 kg) F CS-LTranv Spinal  LIV     Name: Selbe,GIRLB Tamantha     Apgar1: 8  Apgar5: 8  1 Term 07/31/12 [redacted]w[redacted]d  7 lb 3 oz (3.26 kg) F Vag-Spont EPI  LIV    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   Preeclampsia     Past Surgical History:  Procedure Laterality Date   BREAST REDUCTION SURGERY  2002   CESAREAN SECTION N/A 06/16/2018   Procedure: CESAREAN SECTION;  Surgeon: Leonce Garnette BIRCH, MD;  Location: [facility] ORS;  Service: Obstetrics;  Laterality: N/A;   CHOLECYSTECTOMY     COLONOSCOPY     COLONOSCOPY WITH PROPOFOL  N/A 12/01/2019   Procedure: COLONOSCOPY WITH PROPOFOL ;  Surgeon: Therisa Bi, MD;  Location: [facility] ENDOSCOPY;  Service: Gastroenterology;   Laterality: N/A;   tummy tuck  2002    Family History  Problem Relation Age of Onset   Kidney disease Mother    Cancer Father    COPD Father    Asthma Father    Lung cancer Father    Ovarian cancer Maternal Aunt    Congestive Heart Failure Maternal Grandfather    Heart attack Maternal Grandfather    Heart disease Paternal Grandmother    Congestive Heart Failure Paternal Grandmother     Social History   Socioeconomic History   Marital status: Married    Spouse name: Not on file   Number of children: Not on file   Years of education: Not on file   Highest education level: Not on file  Occupational History   Not on file  Tobacco Use   Smoking status: Never   Smokeless tobacco: Never  Vaping Use   Vaping status: Every Day  Substance and Sexual Activity   Alcohol use: Yes    Comment: OCASSIONALLY   Drug use: No   Sexual activity: Yes    Birth control/protection: Implant  Other Topics Concern   Not on file  Social History Narrative   Not on file   Social Drivers of Health   Financial Resource Strain: Low Risk  (06/16/2018)   Overall  Financial Resource Strain (CARDIA)    Difficulty of Paying Living Expenses: Not hard at all  Food Insecurity: No Food Insecurity (09/12/2022)   Hunger Vital Sign    Worried About Running Out of Food in the Last Year: Never true    Ran Out of Food in the Last Year: Never true  Transportation Needs: No Transportation Needs (09/12/2022)   PRAPARE - Administrator, Civil Service (Medical): No    Lack of Transportation (Non-Medical): No  Physical Activity: Unknown (06/16/2018)   Exercise Vital Sign    Days of Exercise per Week: 0 days    Minutes of Exercise per Session: Not on file  Stress: No Stress Concern Present (06/16/2018)   [employer] of Occupational Health - Occupational Stress Questionnaire    Feeling of Stress : Not at all  Social Connections: Moderately Integrated (06/16/2018)   Social Connection and Isolation  Panel    Frequency of Communication with Friends and Family: More than three times a week    Frequency of Social Gatherings with Friends and Family: Twice a week    Attends Religious Services: 1 to 4 times per year    Active Member of [employer] or Organizations: No    Attends Banker Meetings: Never    Marital Status: Married  Catering manager Violence: Not At Risk (09/12/2022)   Humiliation, Afraid, Rape, and Kick questionnaire    Fear of Current or Ex-Partner: No    Emotionally Abused: No    Physically Abused: No    Sexually Abused: No    Current Outpatient Medications on File Prior to Visit  Medication Sig Dispense Refill   buPROPion (WELLBUTRIN XL) 150 MG 24 hr tablet Take 150 mg by mouth daily.     dicyclomine  (BENTYL ) 10 MG capsule Take 1 capsule (10 mg total) by mouth 4 (four) times daily -  before meals and at bedtime for 3 days. 12 capsule 0   FLUoxetine  (PROZAC ) 40 MG capsule Take 40 mg by mouth daily.     levonorgestrel  (LILETTA , 52 MG,) 19.5 MCG/DAY IUD IUD 1 each by Intrauterine route once.     metoprolol  tartrate (LOPRESSOR ) 100 MG tablet Take 1 tablet (100 mg total) by mouth once for 1 dose. 1 tablet 0   naltrexone (DEPADE) 50 MG tablet SMARTSIG:0.25-0.5 Tablet(s) By Mouth Daily     topiramate (TOPAMAX) 100 MG tablet Take 100 mg by mouth daily.     topiramate (TOPAMAX) 25 MG tablet Take 50 mg by mouth 2 (two) times daily. (Patient not taking: Reported on 03/23/2023)     topiramate (TOPAMAX) 50 MG tablet Take 150 mg by mouth daily. (Patient not taking: Reported on 03/23/2023)     No current facility-administered medications on file prior to visit.    Allergies  Allergen Reactions   Penicillins Hives   Latex Rash     Review of Systems Constitutional: negative for chills, fatigue, fevers and sweats Eyes: negative for irritation, redness and visual disturbance Ears, nose, mouth, throat, and face: negative for hearing loss, nasal congestion, snoring and  tinnitus Respiratory: negative for asthma, cough, sputum Cardiovascular: negative for chest pain, dyspnea, exertional chest pressure/discomfort, irregular heart beat, palpitations and syncope Gastrointestinal: negative for abdominal pain, change in bowel habits, nausea and vomiting Genitourinary: negative for abnormal menstrual periods, genital lesions, sexual problems and vaginal discharge, dysuria and urinary incontinence Integument/breast: negative for breast lump, breast tenderness and nipple discharge Hematologic/lymphatic: negative for bleeding and easy bruising Musculoskeletal:negative for back pain and  muscle weakness Neurological: negative for dizziness, headaches, vertigo and weakness Endocrine: negative for diabetic symptoms including polydipsia, polyuria and skin dryness Allergic/Immunologic: negative for hay fever and urticaria      Objective:  There were no vitals taken for this visit. There is no height or weight on file to calculate BMI.    General Appearance:    Alert, cooperative, no distress, appears stated age  Head:    Normocephalic, without obvious abnormality, atraumatic  Eyes:    PERRL, conjunctiva/corneas clear, EOM's intact, both eyes  Ears:    Normal external ear canals, both ears  Nose:   Nares normal, septum midline, mucosa normal, no drainage or sinus tenderness  Throat:   Lips, mucosa, and tongue normal; teeth and gums normal  Neck:   Supple, symmetrical, trachea midline, no adenopathy; thyroid : no enlargement/tenderness/nodules; no carotid bruit or JVD  Back:     Symmetric, no curvature, ROM normal, no CVA tenderness  Lungs:     Clear to auscultation bilaterally, respirations unlabored  Chest Wall:    No tenderness or deformity   Heart:    Regular rate and rhythm, S1 and S2 normal, no murmur, rub or gallop  Breast Exam:    No tenderness, masses, or nipple abnormality  Abdomen:     Soft, non-tender, bowel sounds active all four quadrants, no masses, no  organomegaly.    Genitalia:    Pelvic:external genitalia normal, vagina without lesions, discharge, or tenderness, rectovaginal septum  normal. Cervix normal in appearance, no cervical motion tenderness, no adnexal masses or tenderness.  Uterus normal size, shape, mobile, regular contours, nontender.  Rectal:    Normal external sphincter.  No hemorrhoids appreciated. Internal exam not done.   Extremities:   Extremities normal, atraumatic, no cyanosis or edema  Pulses:   2+ and symmetric all extremities  Skin:   Skin color, texture, turgor normal, no rashes or lesions  Lymph nodes:   Cervical, supraclavicular, and axillary nodes normal  Neurologic:   CNII-XII intact, normal strength, sensation and reflexes throughout   .  Labs:  Lab Results  Component Value Date   WBC 3.9 (L) 12/30/2023   HGB 13.4 12/30/2023   HCT 39.2 12/30/2023   MCV 96.3 12/30/2023   PLT 186 12/30/2023    Lab Results  Component Value Date   CREATININE 0.99 12/30/2023   BUN 11 12/30/2023   NA 136 12/30/2023   K 3.5 12/30/2023   CL 110 12/30/2023   CO2 20 (L) 12/30/2023    Lab Results  Component Value Date   ALT 16 12/30/2023   AST 16 12/30/2023   ALKPHOS 37 (L) 12/30/2023   BILITOT 0.7 12/30/2023    Lab Results  Component Value Date   TSH 0.862 03/12/2023     Assessment:   1. Encounter for well woman exam with routine gynecological exam   2. Screening for diabetes mellitus (DM)   3. Screening cholesterol level   4. Vitamin D  deficiency      Plan:  Blood tests: {blood tests:13147}. Breast self exam technique reviewed and patient encouraged to perform self-exam monthly. Contraception: {contraceptive methods:5051}. Discussed healthy lifestyle modifications. Mammogram {discussed/ordered:14545} Pap smear {discussed/ordered:14545}. Flu vaccine: Follow up in 1 year for annual exam   Kizzie Camelia CROME, CMA [facility]

## 2024-06-30 NOTE — Patient Instructions (Signed)
 Preventive Care 28-38 Years Old, Female  Preventive care refers to lifestyle choices and visits with your health care provider that can promote health and wellness. Preventive care visits are also called wellness exams. What can I expect for my preventive care visit? Counseling During your preventive care visit, your health care provider may ask about your: Medical history, including: Past medical problems. Family medical history. Pregnancy history. Current health, including: Menstrual cycle. Method of birth control. Emotional well-being. Home life and relationship well-being. Sexual activity and sexual health. Lifestyle, including: Alcohol, nicotine or tobacco, and drug use. Access to firearms. Diet, exercise, and sleep habits. Work and work Astronomer. Sunscreen use. Safety issues such as seatbelt and bike helmet use. Physical exam Your health care provider may check your: Height and weight. These may be used to calculate your BMI (body mass index). BMI is a measurement that tells if you are at a healthy weight. Waist circumference. This measures the distance around your waistline. This measurement also tells if you are at a healthy weight and may help predict your risk of certain diseases, such as type 2 diabetes and high blood pressure. Heart rate and blood pressure. Body temperature. Skin for abnormal spots. What immunizations do I need?  Vaccines are usually given at various ages, according to a schedule. Your health care provider will recommend vaccines for you based on your age, medical history, and lifestyle or other factors, such as travel or where you work. What tests do I need? Screening Your health care provider may recommend screening tests for certain conditions. This may include: Pelvic exam and Pap test. Lipid and cholesterol levels. Diabetes screening. This is done by checking your blood sugar (glucose) after you have not eaten for a while  (fasting). Hepatitis B test. Hepatitis C test. HIV (human immunodeficiency virus) test. STI (sexually transmitted infection) testing, if you are at risk. BRCA-related cancer screening. This may be done if you have a family history of breast, ovarian, tubal, or peritoneal cancers. Talk with your health care provider about your test results, treatment options, and if necessary, the need for more tests. Follow these instructions at home: Eating and drinking  Eat a healthy diet that includes fresh fruits and vegetables, whole grains, lean protein, and low-fat dairy products. Take vitamin and mineral supplements as recommended by your health care provider. Do not drink alcohol if: Your health care provider tells you not to drink. You are pregnant, may be pregnant, or are planning to become pregnant. If you drink alcohol: Limit how much you have to 0-1 drink a day. Know how much alcohol is in your drink. In the [place] one drink equals one 12 oz bottle of beer (355 mL), one 5 oz glass of wine (148 mL), or one 1 oz glass of hard liquor (44 mL). Lifestyle Brush your teeth every morning and night with fluoride toothpaste. Floss one time each day. Exercise for at least 30 minutes 5 or more days each week. Do not use any products that contain nicotine or tobacco. These products include cigarettes, chewing tobacco, and vaping devices, such as e-cigarettes. If you need help quitting, ask your health care provider. Do not use drugs. If you are sexually active, practice safe sex. Use a condom or other form of protection to prevent STIs. If you do not wish to become pregnant, use a form of birth control. If you plan to become pregnant, see your health care provider for a prepregnancy visit. Find healthy ways to manage stress, such as:  Meditation, yoga, or listening to music. Journaling. Talking to a trusted person. Spending time with friends and family. Minimize exposure to UV radiation to reduce your  risk of skin cancer. Safety Always wear your seat belt while driving or riding in a vehicle. Do not drive: If you have been drinking alcohol. Do not ride with someone who has been drinking. If you have been using any mind-altering substances or drugs. While texting. When you are tired or distracted. Wear a helmet and other protective equipment during sports activities. If you have firearms in your house, make sure you follow all gun safety procedures. Seek help if you have been physically or sexually abused. What's next? Go to your health care provider once a year for an annual wellness visit. Ask your health care provider how often you should have your eyes and teeth checked. Stay up to date on all vaccines. This information is not intended to replace advice given to you by your health care provider. Make sure you discuss any questions you have with your health care provider. Document Revised: 02/20/2021 Document Reviewed: 02/20/2021 Elsevier Patient Education  2024 [employer]     How to Do a Breast Self-Exam Doing breast self-exams can help you stay healthy. They're one way to know what's normal for your breasts. They can help you catch a problem while it's still small and can be treated. You need to: Check your breasts often. Tell your doctor about any changes. You should do breast self-exams even if you have breast implants. What you need: A mirror. A well-lit room. A pillow or other soft object. How to do a breast self-exam Look for changes  Take off all the clothes above your waist. Stand in front of a mirror in a room with good lighting. Put your hands down at your sides. Compare your breasts in the mirror. Look for difference between them, such as: Differences in shape. Differences in size. Wrinkles, dips, and bumps in one breast and not the other. Look at each breast for skin changes, such as: Redness. Scaly spots. Spots where your skin is  thicker. Dimpling. Open sores. Look for changes in your nipples, such as: Fluid coming out of a nipple. Fluid around a nipple. Bleeding. Dimpling. Redness. A nipple that looks pushed in or that has changed position. Feel for changes Lie on your back. Feel each breast. To do this: Pick a breast to feel. Place a pillow under the shoulder closest to that breast. Put the arm closest to that breast behind your head. Feel the breast using the hand of your other arm. Use the pads of your three middle fingers to make small circles starting near the nipple. Use light, medium, and firm pressure. Keep making circles, moving down over the breast. Stop when you feel your ribs. Start making circles with your fingers again, this time going up until you reach your collarbone. Then, make circles out across your breast and into your armpit area. Squeeze your nipple. Check for fluid and lumps. Do these steps again to check your other breast. Sit or stand in the tub or shower. With soapy water on your skin, feel each breast the same way you did when you were lying down. Write down what you find Writing down what you find can help you keep track of what you want to tell your doctor. Write down: What's normal for each breast. Any changes you find. Write down: The kind of change. If your breast feels tender or painful.  Any lump you find. Write down its size and where it is. When you last had your period. General tips If you're breastfeeding, the best time to check your breasts is after you feed your baby or after you use a breast pump. If you get a period, the best time to check your breasts is 5-7 days after your period ends. With time, you'll get more used to doing the self-exam. You'll also start to know if there are changes in your breasts. Contact a doctor if: You see a change in the shape or size of your breasts or nipples. You see a change in the skin of your breast or nipples. You have fluid  coming from your nipples that isn't normal. You find a new lump or thick area. You have breast pain. You have any concerns about your breast health. This information is not intended to replace advice given to you by your health care provider. Make sure you discuss any questions you have with your health care provider. Document Revised: 11/04/2023 Document Reviewed: 11/04/2023 Elsevier Patient Education  2025 [employer].

## 2024-07-01 ENCOUNTER — Ambulatory Visit: Admitting: Certified Nurse Midwife

## 2024-07-01 ENCOUNTER — Encounter: Payer: Self-pay | Admitting: Certified Nurse Midwife

## 2024-07-01 VITALS — BP 95/58 | HR 77 | Resp 16 | Ht 68.0 in | Wt 181.1 lb

## 2024-07-01 DIAGNOSIS — E559 Vitamin D deficiency, unspecified: Secondary | ICD-10-CM

## 2024-07-01 DIAGNOSIS — Z13220 Encounter for screening for lipoid disorders: Secondary | ICD-10-CM

## 2024-07-01 DIAGNOSIS — Z975 Presence of (intrauterine) contraceptive device: Secondary | ICD-10-CM

## 2024-07-01 DIAGNOSIS — Z01419 Encounter for gynecological examination (general) (routine) without abnormal findings: Secondary | ICD-10-CM | POA: Diagnosis not present

## 2024-07-01 DIAGNOSIS — Z131 Encounter for screening for diabetes mellitus: Secondary | ICD-10-CM

## 2024-07-02 LAB — CBC
Hematocrit: 39.8 % (ref 34.0–46.6)
Hemoglobin: 13.2 g/dL (ref 11.1–15.9)
MCH: 33.1 pg — ABNORMAL HIGH (ref 26.6–33.0)
MCHC: 33.2 g/dL (ref 31.5–35.7)
MCV: 100 fL — ABNORMAL HIGH (ref 79–97)
Platelets: 208 x10E3/uL (ref 150–450)
RBC: 3.99 x10E6/uL (ref 3.77–5.28)
RDW: 11.5 % — ABNORMAL LOW (ref 11.7–15.4)
WBC: 5 x10E3/uL (ref 3.4–10.8)

## 2024-07-02 LAB — TSH: TSH: 1.25 u[IU]/mL (ref 0.450–4.500)
